# Patient Record
Sex: Female | Born: 1980 | Race: Black or African American | Hispanic: No | Marital: Single | Smoking: Current every day smoker
Health system: Southern US, Community
[De-identification: ages and names within clinical notes are randomized; demographics above are authoritative.]

## PROBLEM LIST (undated history)

## (undated) DIAGNOSIS — F32A Depression, unspecified: Secondary | ICD-10-CM

## (undated) DIAGNOSIS — F329 Major depressive disorder, single episode, unspecified: Secondary | ICD-10-CM

## (undated) DIAGNOSIS — I10 Essential (primary) hypertension: Secondary | ICD-10-CM

## (undated) DIAGNOSIS — E079 Disorder of thyroid, unspecified: Secondary | ICD-10-CM

## (undated) DIAGNOSIS — E119 Type 2 diabetes mellitus without complications: Secondary | ICD-10-CM

## (undated) DIAGNOSIS — F419 Anxiety disorder, unspecified: Secondary | ICD-10-CM

## (undated) DIAGNOSIS — E063 Autoimmune thyroiditis: Secondary | ICD-10-CM

## (undated) DIAGNOSIS — M797 Fibromyalgia: Secondary | ICD-10-CM

## (undated) HISTORY — PX: ABDOMINAL HYSTERECTOMY: SHX81

## (undated) HISTORY — PX: BREAST SURGERY: SHX581

## (undated) HISTORY — PX: TONSILLECTOMY: SUR1361

---
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
## 2019-02-17 ENCOUNTER — Other Ambulatory Visit: Payer: Self-pay

## 2019-02-17 ENCOUNTER — Encounter (HOSPITAL_COMMUNITY): Payer: Self-pay

## 2019-02-17 ENCOUNTER — Emergency Department (HOSPITAL_COMMUNITY): Payer: Medicaid Other

## 2019-02-17 ENCOUNTER — Emergency Department (HOSPITAL_COMMUNITY)
Admission: EM | Admit: 2019-02-17 | Discharge: 2019-02-17 | Disposition: A | Discharge status: Home / Self Care | Payer: Medicaid Other | Attending: Emergency Medicine | Admitting: Emergency Medicine

## 2019-02-17 DIAGNOSIS — F419 Anxiety disorder, unspecified: Secondary | ICD-10-CM | POA: Diagnosis not present

## 2019-02-17 DIAGNOSIS — F17210 Nicotine dependence, cigarettes, uncomplicated: Secondary | ICD-10-CM | POA: Insufficient documentation

## 2019-02-17 DIAGNOSIS — R51 Headache: Secondary | ICD-10-CM | POA: Insufficient documentation

## 2019-02-17 DIAGNOSIS — E119 Type 2 diabetes mellitus without complications: Secondary | ICD-10-CM | POA: Insufficient documentation

## 2019-02-17 DIAGNOSIS — I10 Essential (primary) hypertension: Secondary | ICD-10-CM | POA: Insufficient documentation

## 2019-02-17 DIAGNOSIS — R519 Headache, unspecified: Secondary | ICD-10-CM

## 2019-02-17 DIAGNOSIS — E079 Disorder of thyroid, unspecified: Secondary | ICD-10-CM | POA: Diagnosis not present

## 2019-02-17 HISTORY — DX: Fibromyalgia: M79.7

## 2019-02-17 HISTORY — DX: Anxiety disorder, unspecified: F41.9

## 2019-02-17 HISTORY — DX: Disorder of thyroid, unspecified: E07.9

## 2019-02-17 HISTORY — DX: Depression, unspecified: F32.A

## 2019-02-17 HISTORY — DX: Essential (primary) hypertension: I10

## 2019-02-17 HISTORY — DX: Type 2 diabetes mellitus without complications: E11.9

## 2019-02-17 LAB — CBC WITH DIFFERENTIAL/PLATELET
Abs Immature Granulocytes: 0.01 10*3/uL (ref 0.00–0.07)
Basophils Absolute: 0 10*3/uL (ref 0.0–0.1)
Basophils Relative: 0 %
Eosinophils Absolute: 0.1 10*3/uL (ref 0.0–0.5)
Eosinophils Relative: 1 %
HCT: 38.8 % (ref 36.0–46.0)
Hemoglobin: 12.2 g/dL (ref 12.0–15.0)
Immature Granulocytes: 0 %
Lymphocytes Relative: 28 %
Lymphs Abs: 1.7 10*3/uL (ref 0.7–4.0)
MCH: 26.8 pg (ref 26.0–34.0)
MCHC: 31.4 g/dL (ref 30.0–36.0)
MCV: 85.3 fL (ref 80.0–100.0)
Monocytes Absolute: 0.4 10*3/uL (ref 0.1–1.0)
Monocytes Relative: 6 %
Neutro Abs: 3.8 10*3/uL (ref 1.7–7.7)
Neutrophils Relative %: 65 %
Platelets: 233 10*3/uL (ref 150–400)
RBC: 4.55 MIL/uL (ref 3.87–5.11)
RDW: 12.9 % (ref 11.5–15.5)
WBC: 6 10*3/uL (ref 4.0–10.5)
nRBC: 0 % (ref 0.0–0.2)

## 2019-02-17 LAB — COMPREHENSIVE METABOLIC PANEL
ALT: 20 U/L (ref 0–44)
AST: 23 U/L (ref 15–41)
Albumin: 3.9 g/dL (ref 3.5–5.0)
Alkaline Phosphatase: 106 U/L (ref 38–126)
Anion gap: 10 (ref 5–15)
BUN: 8 mg/dL (ref 6–20)
CO2: 25 mmol/L (ref 22–32)
Calcium: 9 mg/dL (ref 8.9–10.3)
Chloride: 104 mmol/L (ref 98–111)
Creatinine, Ser: 0.73 mg/dL (ref 0.44–1.00)
GFR calc Af Amer: >60 mL/min (ref >60)
GFR calc non Af Amer: >60 mL/min (ref >60)
Glucose, Bld: 100 mg/dL — ABNORMAL HIGH (ref 70–99)
Potassium: 3.8 mmol/L (ref 3.5–5.1)
Sodium: 139 mmol/L (ref 135–145)
Total Bilirubin: 0.7 mg/dL (ref 0.3–1.2)
Total Protein: 7.9 g/dL (ref 6.5–8.1)

## 2019-02-17 LAB — I-STAT BETA HCG BLOOD, ED (MC, WL, AP ONLY): I-stat hCG, quantitative: <5 m[IU]/mL (ref <5)

## 2019-02-17 LAB — TSH: TSH: 2.048 u[IU]/mL (ref 0.350–4.500)

## 2019-02-17 MED ORDER — PROCHLORPERAZINE EDISYLATE 10 MG/2ML IJ SOLN
5.0000 mg | Freq: Once | INTRAMUSCULAR | Status: AC
  Administered 2019-02-17: 5 mg via INTRAVENOUS
  Filled 2019-02-17: qty 2

## 2019-02-17 MED ORDER — SODIUM CHLORIDE 0.9 % IV BOLUS
1000.0000 mL | Freq: Once | INTRAVENOUS | Status: AC
  Administered 2019-02-17: 1000 mL via INTRAVENOUS

## 2019-02-17 MED ORDER — DIPHENHYDRAMINE HCL 50 MG/ML IJ SOLN
12.5000 mg | Freq: Once | INTRAMUSCULAR | Status: AC
  Administered 2019-02-17: 12.5 mg via INTRAVENOUS
  Filled 2019-02-17: qty 1

## 2019-02-17 NOTE — Discharge Instructions (Signed)

## 2019-02-17 NOTE — ED Notes (Signed)
Bed: WLPT4 Expected date:  Expected time:  Means of arrival:  Comments: 

## 2019-02-17 NOTE — ED Provider Notes (Signed)
[facility]-EMERGENCY DEPT Provider Note   CSN: [id number] Arrival date & time: 02/17/19  1309     History   Chief Complaint Chief Complaint  Patient presents with  . Headache  . Anxiety    HPI Pamela Rivers is a 38 y.o. female.  Who presents the emergency department with chief complaint of headache, nausea vomiting and anxiety.  She has a past medical history of anxiety, depression, hypertension, thyroid disease.  The patient states that this morning she awoke with a severe throbbing right-sided headache.  She has had nausea and vomiting.  She is somewhat pressured in her speech and states that she just moved here 22 days ago with her girlfriend from [facility] [place].  She does not know anyone here and just found out that her longtime girlfriend has been cheating on her.  She has had low appetite, tremulousness, intermittent vomiting.  She denies diarrhea or abdominal pain.  She does state that her mother had an aneurysm it is somewhat concerned about this.  She denies any photophobia, phonophobia, unilateral weakness, difficulty with speech or swallowing, neck stiffness rash or fever.  She does state that the headache came on suddenly and is the worst headache she is ever had.     HPI  Past Medical History:  Diagnosis Date  . Anxiety   . Depression   . Diabetes mellitus without complication (HCC)   . Fibromyalgia   . Hypertension   . Thyroid disease     There are no active problems to display for this patient.   Past Surgical History:  Procedure Laterality Date  . ABDOMINAL HYSTERECTOMY    . BREAST SURGERY    . TONSILLECTOMY       OB History   No obstetric history on file.      Home Medications    Prior to Admission medications   Not on File    Family History Family History  Problem Relation Age of Onset  . Diabetes Mother   . Hypertension Mother   . Diabetes Father   . Hypertension Father     Social History Social History    Tobacco Use  . Smoking status: Current Every Day Smoker    Packs/day: 0.30    Types: Cigarettes  . Smokeless tobacco: Never Used  Substance Use Topics  . Alcohol use: Yes    Comment: occasionally  . Drug use: Yes    Types: Marijuana     Allergies   Vitamin d analogs   Review of Systems Review of Systems  Ten systems reviewed and are negative for acute change, except as noted in the HPI.   Physical Exam Updated Vital Signs BP (!) 139/94 (BP Location: Left Arm)   Pulse 97   Temp 98.5 F (36.9 C) (Oral)   Resp 13   Ht 5\' 4"  (1.626 m)   Wt 108 kg   SpO2 98%   BMI 40.85 kg/m   Physical Exam Vitals signs and nursing note reviewed.  Constitutional:      General: She is not in acute distress.    Appearance: She is well-developed. She is obese. She is not diaphoretic.  HENT:     Head: Normocephalic and atraumatic.  Eyes:     General: No scleral icterus.    Conjunctiva/sclera: Conjunctivae normal.     Pupils: Pupils are equal, round, and reactive to light.     Comments: No horizontal, vertical or rotational nystagmus  Neck:     Musculoskeletal: Normal  range of motion and neck supple.     Comments: Full active and passive ROM without pain No midline or paraspinal tenderness No nuchal rigidity or meningeal signs Cardiovascular:     Rate and Rhythm: Normal rate and regular rhythm.  Pulmonary:     Effort: Pulmonary effort is normal. No respiratory distress.     Breath sounds: Normal breath sounds. No wheezing or rales.  Abdominal:     General: Bowel sounds are normal.     Palpations: Abdomen is soft.     Tenderness: There is no abdominal tenderness. There is no guarding or rebound.  Musculoskeletal: Normal range of motion.  Lymphadenopathy:     Cervical: No cervical adenopathy.  Skin:    General: Skin is warm and dry.     Findings: No rash.  Neurological:     Mental Status: She is alert and oriented to person, place, and time.     GCS: GCS eye subscore is 4.  GCS verbal subscore is 5. GCS motor subscore is 6.     Cranial Nerves: No cranial nerve deficit.     Motor: No abnormal muscle tone.     Coordination: Coordination normal.     Comments: Mental Status:  Alert, oriented, thought content appropriate. Speech fluent without evidence of aphasia. Able to follow 2 step commands without difficulty.  Cranial Nerves:  II:  Peripheral visual fields grossly normal, pupils equal, round, reactive to light III,IV, VI: ptosis not present, extra-ocular motions intact bilaterally  V,VII: smile symmetric, facial light touch sensation equal VIII: hearing grossly normal bilaterally  IX,X: midline uvula rise  XI: bilateral shoulder shrug equal and strong XII: midline tongue extension  Motor:  5/5 in upper and lower extremities bilaterally including strong and equal grip strength and dorsiflexion/plantar flexion Sensory: Pinprick and light touch normal in all extremities.  Cerebellar: normal finger-to-nose with bilateral upper extremities Gait: normal gait and balance CV: distal pulses palpable throughout   Psychiatric:        Behavior: Behavior normal.        Thought Content: Thought content normal.        Judgment: Judgment normal.      ED Treatments / Results  Labs (all labs ordered are listed, but only abnormal results are displayed) Labs Reviewed - No data to display  EKG    Radiology No results found.  Procedures Procedures (including critical care time)  Medications Ordered in ED Medications - No data to display   Initial Impression / Assessment and Plan / ED Course  I have reviewed the triage vital signs and the nursing notes.  Pertinent labs & imaging results that were available during my care of the patient were reviewed by me and considered in my medical decision making (see chart for details).        Patient headache is improved.  I personally reviewed the patient's head CT which is negative for acute hemorrhagic stroke or  other abnormality.  CBC is without abnormality, no leukocytosis.  TSH is still in process.  She is a negative pregnancy.  CMP in process however patient feels that she is ready to leave and does not wish to stating longer.  I do not feel that she needs to leave AGAINST MEDICAL ADVICE although her CMP is still pending.  Patient appears appropriate for discharge.  I have referred her to outpatient psychiatric facilities for help with her emotional distress and anxiety.  Gust return precautions f Final Clinical Impressions(s) / ED Diagnoses  Final diagnoses:  None    ED Discharge Orders    None       Margarita Mail, PA-C 02/17/19 1715    Davonna Belling, MD 02/17/19 2200

## 2019-02-17 NOTE — ED Triage Notes (Signed)
Patient c/o right sided headache that started this AM. Patient states she has been having intermittent emesis since 01/26/19. Patient states, "I am extremely stressed." I  Do not have anything for my stress." Patient states she used to take hydralazine, but she overdosed on that during a suicide attempt.

## 2019-08-02 ENCOUNTER — Ambulatory Visit: Payer: Self-pay

## 2019-08-02 NOTE — Telephone Encounter (Signed)
  Returned call to patient who states that she has had hypertension for a few days.  She says she has not felt well and checked her BP and found 142/97 yesterday and has a broken blood vesicle in her eye. Today her BP 159/100 and she took an asa and some apple cider vinegar and it is now down to 127/78.  She states that she has a headache. She has been on medication in the past  Per protocol patient was told that she needs to seek medical attention at [facility] or ER.  She verbalized understanding.  She was also give the numbers for several community health centers who take patient without insurance. She verbalized understanding of all information. Reason for Disposition . [3] Systolic BP  >= 818 OR Diastolic >= 299  AND [3] having NO cardiac or neurologic symptoms  Answer Assessment - Initial Assessment Questions 1. BLOOD PRESSURE: "What is the blood pressure?" "Did you take at least two measurements 5 minutes apart?"     142/97  Yesterday  Today 138/97  159/100 now 127/78 2. ONSET: "When did you take your blood pressure?"     home 3. HOW: "How did you obtain the blood pressure?" (e.g., visiting nurse, automatic home BP monitor)     home 4. HISTORY: "Do you have a history of high blood pressure?"     Yes 8 years ago the 5. MEDICATIONS: "Are you taking any medications for blood pressure?" "Have you missed any doses recently?"    No medications 6. OTHER SYMPTOMS: "Do you have any symptoms?" (e.g., headache, chest pain, blurred vision, difficulty breathing, weakness)     Headache, rupture in eye 7. PREGNANCY: "Is there any chance you are pregnant?" "When was your last menstrual period?"    No histerectomy  Protocols used: HIGH BLOOD PRESSURE-A-AH

## 2019-11-18 ENCOUNTER — Encounter (HOSPITAL_COMMUNITY): Payer: Self-pay

## 2019-11-18 ENCOUNTER — Other Ambulatory Visit: Payer: Self-pay

## 2019-11-18 ENCOUNTER — Emergency Department (HOSPITAL_COMMUNITY)
Admission: EM | Admit: 2019-11-18 | Discharge: 2019-11-18 | Disposition: A | Discharge status: Home / Self Care | Payer: Medicaid Other | Attending: Emergency Medicine | Admitting: Emergency Medicine

## 2019-11-18 DIAGNOSIS — E119 Type 2 diabetes mellitus without complications: Secondary | ICD-10-CM | POA: Diagnosis not present

## 2019-11-18 DIAGNOSIS — F17210 Nicotine dependence, cigarettes, uncomplicated: Secondary | ICD-10-CM | POA: Diagnosis not present

## 2019-11-18 DIAGNOSIS — R519 Headache, unspecified: Secondary | ICD-10-CM | POA: Insufficient documentation

## 2019-11-18 DIAGNOSIS — I10 Essential (primary) hypertension: Secondary | ICD-10-CM | POA: Diagnosis not present

## 2019-11-18 DIAGNOSIS — H538 Other visual disturbances: Secondary | ICD-10-CM | POA: Insufficient documentation

## 2019-11-18 DIAGNOSIS — H5712 Ocular pain, left eye: Secondary | ICD-10-CM | POA: Diagnosis present

## 2019-11-18 DIAGNOSIS — H539 Unspecified visual disturbance: Secondary | ICD-10-CM

## 2019-11-18 LAB — CBC WITH DIFFERENTIAL/PLATELET
Abs Immature Granulocytes: 0.02 10*3/uL (ref 0.00–0.07)
Basophils Absolute: 0 10*3/uL (ref 0.0–0.1)
Basophils Relative: 1 %
Eosinophils Absolute: 0.2 10*3/uL (ref 0.0–0.5)
Eosinophils Relative: 3 %
HCT: 39.3 % (ref 36.0–46.0)
Hemoglobin: 12.1 g/dL (ref 12.0–15.0)
Immature Granulocytes: 0 %
Lymphocytes Relative: 35 %
Lymphs Abs: 2.2 10*3/uL (ref 0.7–4.0)
MCH: 25.9 pg — ABNORMAL LOW (ref 26.0–34.0)
MCHC: 30.8 g/dL (ref 30.0–36.0)
MCV: 84.2 fL (ref 80.0–100.0)
Monocytes Absolute: 0.4 10*3/uL (ref 0.1–1.0)
Monocytes Relative: 6 %
Neutro Abs: 3.5 10*3/uL (ref 1.7–7.7)
Neutrophils Relative %: 55 %
Platelets: 241 10*3/uL (ref 150–400)
RBC: 4.67 MIL/uL (ref 3.87–5.11)
RDW: 12.6 % (ref 11.5–15.5)
WBC: 6.2 10*3/uL (ref 4.0–10.5)
nRBC: 0 % (ref 0.0–0.2)

## 2019-11-18 LAB — BASIC METABOLIC PANEL
Anion gap: 8 (ref 5–15)
BUN: 9 mg/dL (ref 6–20)
CO2: 24 mmol/L (ref 22–32)
Calcium: 8.7 mg/dL — ABNORMAL LOW (ref 8.9–10.3)
Chloride: 105 mmol/L (ref 98–111)
Creatinine, Ser: 0.57 mg/dL (ref 0.44–1.00)
GFR calc Af Amer: >60 mL/min (ref >60)
GFR calc non Af Amer: >60 mL/min (ref >60)
Glucose, Bld: 140 mg/dL — ABNORMAL HIGH (ref 70–99)
Potassium: 4.4 mmol/L (ref 3.5–5.1)
Sodium: 137 mmol/L (ref 135–145)

## 2019-11-18 MED ORDER — TETRACAINE HCL 0.5 % OP SOLN
2.0000 [drp] | Freq: Once | OPHTHALMIC | Status: AC
  Administered 2019-11-18: 2 [drp] via OPHTHALMIC
  Filled 2019-11-18: qty 4

## 2019-11-18 MED ORDER — FLUORESCEIN SODIUM 1 MG OP STRP
1.0000 | ORAL_STRIP | Freq: Once | OPHTHALMIC | Status: AC
  Administered 2019-11-18: 1 via OPHTHALMIC
  Filled 2019-11-18: qty 1

## 2019-11-18 NOTE — Discharge Instructions (Addendum)
Please drive to the office of Dr Dione Booze MD, at the address listed above, as soon as you are discharged from the ED.  They are expecting you for an office visit.  Bring these papers to show to them.  For Dr Dione Booze:  In the ED:  IOP: 19 mm hg (left), Vision 20/20 bilaterally, no corneal abrasion on fluorescein staining, peripheral fields intact, no detachment visualized on ocular ultrasound.  Vision gradually improving from arrival to discharge.   Possible complex migraine.  Neurologist did not feel this was consistent with optic neuritis, aneurysm, or RAO/RVO.  Recommended full ophthalmological exam.

## 2019-11-18 NOTE — ED Provider Notes (Addendum)
[facility]-EMERGENCY DEPT Provider Note   CSN: [id number] Arrival date & time: 11/18/19  1048     History Chief Complaint  Patient presents with  . Headache    "Pamela Cloud" Rivers is a 39 y.o. female (prefers female pronouns) with fibromyalgia, hypertension, diabetes, presented to emergency department with vision loss and pain in the left eye.  Patient reports that he woke up early this morning with pain in his left eye and loss of vision.  He said everything was extremely blurry (but no darkness) in his left eye.  Also reports he was having significant pain in his eye and directly behind the eye in his head.  He washed his eye and the vision gradually improved, from the point that he couldn't make out anything on his phone, to blurry letters, and now appears "mostly clear."  Currently he feels there is "double vision" in his left eye, "like words are stacked on top of themselves funny," but denies any blackness in his peripheral fields.  He does not wear contacts and has excellent vision at baseline.  He does have a history of headaches and migraines, but these tend to be located in the right temple and are never associated with vision loss or any neurological symptoms.  He reports a family history of brain aneurysm in his mother.  He is unaware of any family history of MS but says that he was not raised by his biological parents is unclear about their extended family history.  The patient is NOT on hormone therapy and hasn't been for nearly 3 years.  HTN - untreated recently, was taken off medications after weight loss a few months ago, but has put weight back on  HPI     Past Medical History:  Diagnosis Date  . Anxiety   . Depression   . Diabetes mellitus without complication (HCC)   . Fibromyalgia   . Hypertension   . Thyroid disease     There are no problems to display for this patient.   Past Surgical History:  Procedure Laterality Date  . ABDOMINAL  HYSTERECTOMY    . BREAST SURGERY    . TONSILLECTOMY       OB History   No obstetric history on file.     Family History  Problem Relation Age of Onset  . Diabetes Mother   . Hypertension Mother   . Diabetes Father   . Hypertension Father     Social History   Tobacco Use  . Smoking status: Current Every Day Smoker    Packs/day: 0.30    Types: Cigarettes  . Smokeless tobacco: Never Used  Substance Use Topics  . Alcohol use: Yes    Comment: occasionally  . Drug use: Not Currently    Types: Marijuana    Home Medications Prior to Admission medications   Not on File    Allergies    Other and Vitamin d analogs  Review of Systems   Review of Systems  Constitutional: Negative for chills and fever.  Eyes: Positive for photophobia, pain and visual disturbance. Negative for discharge, redness and itching.  Respiratory: Negative for cough and shortness of breath.   Cardiovascular: Negative for chest pain and palpitations.  Gastrointestinal: Negative for nausea and vomiting.  Skin: Negative for pallor and rash.  Neurological: Positive for headaches. Negative for dizziness, seizures, syncope, facial asymmetry, weakness and numbness.  All other systems reviewed and are negative.   Physical Exam Updated Vital Signs BP (!) 143/99  Pulse 63   Temp 98.4 F (36.9 C) (Oral)   Resp 18   Ht 5' 3.75" (1.619 m)   Wt 108.4 kg   SpO2 100%   BMI 41.35 kg/m   Physical Exam Vitals and nursing note reviewed.  Constitutional:      General: She is not in acute distress.    Appearance: She is obese.  HENT:     Head: Normocephalic and atraumatic.     Comments: No temporal artery ttp or claudication Eyes:     Conjunctiva/sclera: Conjunctivae normal.     Comments: Bilateral horizontal beating nystagmus with bilateral gaze (patient claims this is chronic) PERRL, 3 mm bilaterally Conjunctiva are clear Vision 20/20 in bilateral eyes Peripheral fields are intact No visible  lesions of the external eye  Cardiovascular:     Rate and Rhythm: Normal rate and regular rhythm.     Heart sounds: Normal heart sounds.  Pulmonary:     Effort: Pulmonary effort is normal. No respiratory distress.     Breath sounds: Normal breath sounds.  Musculoskeletal:     Cervical back: Normal range of motion and neck supple.  Skin:    General: Skin is warm and dry.  Neurological:     Mental Status: She is alert.     GCS: GCS eye subscore is 4. GCS verbal subscore is 5. GCS motor subscore is 6.     Cranial Nerves: Cranial nerves are intact.     Sensory: Sensation is intact.     Motor: Motor function is intact.     Gait: Gait is intact.     Comments: No change in color phenomenon (red color the same in both eyes)     ED Results / Procedures / Treatments   Labs (all labs ordered are listed, but only abnormal results are displayed) Labs Reviewed  BASIC METABOLIC PANEL - Abnormal; Notable for the following components:      Result Value   Glucose, Bld 140 (*)    Calcium 8.7 (*)    All other components within normal limits  CBC WITH DIFFERENTIAL/PLATELET - Abnormal; Notable for the following components:   MCH 25.9 (*)    All other components within normal limits    EKG None  Radiology No results found.  Procedures Ultrasound ED Ocular  Date/Time: 11/18/2019 6:15 PM Performed by: Wyvonnia Dusky, MD Authorized by: Wyvonnia Dusky, MD   PROCEDURE DETAILS:    Indications: eye pain and visual change     Assessed:  Left eye   Left eye axial view: obtained     Left eye saggital view: obtained     Images: archived     Limitations:  None LEFT EYE FINDINGS:     no foreign body noted in left eye    left eye lens not dislodged    no evidence of retinal detachment of the left eye    no vitreous hemorrhage in left eye   (including critical care time)  Medications Ordered in ED Medications  fluorescein ophthalmic strip 1 strip (1 strip Both Eyes Given by Other  11/18/19 1308)  tetracaine (PONTOCAINE) 0.5 % ophthalmic solution 2 drop (2 drops Both Eyes Given by Other 11/18/19 1308)    ED Course  I have reviewed the triage vital signs and the nursing notes.  Pertinent labs & imaging results that were available during my care of the patient were reviewed by me and considered in my medical decision making (see chart for details).  39 year old  FTM (Prefers "Pamela Cloud") w/ family hx of brain aneurysm presenting to the ED with sudden painful vision loss in his left eye beginning earlier this morning.  Vision has since improved and is currently 20/20 in the ED with no peripheral visual defects.  However he continues to have a sense of blurred or double vision only in his left eye when staring at letters.  He also continues to report some mild persistent pain in his left eye and headache.  No evidence of injury to the eye itself, no abrasions, no tearing of the eye.  No photophobia.  No corneal abrasion.  No scleral injection  Doubtful of acute angle glaucoma, no redness of the eye, no fixed pupil or photophobia.    Doubtful of uveitis or conjunctivis with no scleral injection, pain or photophobia  Vision intact - less likely retinal detachment, and no findings of detachment on ocular ultrasound.  Detachment is also not typically painful.  Neurologically, I discussed this case with Dr Amada Jupiter who says that a single day of sudden-onset painful vision loss and recovery is inconsistent with optic neuritis.  No loss or change in color vision (red) either on bilateral comparative ocular exam.  This is less likely TIA or RAO with the patient's lack of risk factors and associated pain.  No temporal artery pain or claudication to suggest temporal arteritis.  The patient also does not fit the age demographic, and I would not expect this condition to improve so rapidly either.  This may be a complex migraine.  Pain and vision have been gradually improving in the ED.  I  think it's reasonable to have him seen for an ophthalmological exam in the office, and discussed this with Dr. Marchelle Gearing below.  Clinical Course as of Nov 18 1818  Fri Nov 18, 2019  1244 I spoke to Dr Amada Jupiter of neurology who tells me that this history is NOT consistent with any emergent neurological process including optic neuritis (the speed of symptom onset and offset), or related to brain aneurysm, or RAO/RVO given her associated pain.  He recommended further evaluation for ophthalmological causes of blurred vision.   [MT]  1341 Bedside US shows no retinal detachment.  IOP 19 mm hg left eye. No corneal abrasion.  I spoke to Dr Dione Booze ohpthalmology who recommended having patient come to office for ocular eval   [MT]    Clinical Course User Index [MT] Luisfelipe Engelstad, Kermit Balo, MD   Final Clinical Impression(s) / ED Diagnoses Final diagnoses:  Vision changes    Rx / DC Orders ED Discharge Orders    None       Hridhaan Yohn, Kermit Balo, MD 11/18/19 1820    Terald Sleeper, MD 11/18/19 Rickey Primus

## 2019-11-18 NOTE — ED Triage Notes (Signed)
Patient reports that she has migraines on the right side of her head, but last night had pain on the left side of her head. Patient states she lost vision in her left eye totally that lasted an hour. Today the headache on the left is about the same, but is now having double vision. Patient denies any N/v or sensitivity to light.

## 2019-11-18 NOTE — ED Notes (Signed)
Eye supply to bedside

## 2019-12-01 ENCOUNTER — Encounter: Payer: Self-pay | Admitting: Allergy & Immunology

## 2019-12-01 ENCOUNTER — Other Ambulatory Visit: Payer: Self-pay

## 2019-12-01 ENCOUNTER — Ambulatory Visit (INDEPENDENT_AMBULATORY_CARE_PROVIDER_SITE_OTHER): Payer: Medicaid Other | Admitting: Allergy & Immunology

## 2019-12-01 VITALS — BP 132/90 | HR 113 | Temp 97.6°F | Resp 20 | Ht 63.1 in | Wt 247.0 lb

## 2019-12-01 DIAGNOSIS — J310 Chronic rhinitis: Secondary | ICD-10-CM | POA: Diagnosis not present

## 2019-12-01 DIAGNOSIS — T7800XD Anaphylactic reaction due to unspecified food, subsequent encounter: Secondary | ICD-10-CM

## 2019-12-01 DIAGNOSIS — F17200 Nicotine dependence, unspecified, uncomplicated: Secondary | ICD-10-CM

## 2019-12-01 DIAGNOSIS — L2089 Other atopic dermatitis: Secondary | ICD-10-CM

## 2019-12-01 MED ORDER — FLUTICASONE PROPIONATE 50 MCG/ACT NA SUSP: 1.0000 | Freq: Every day | NASAL | 5 refills | Status: AC

## 2019-12-01 MED ORDER — CETIRIZINE HCL 10 MG PO TABS: 10.0000 mg | ORAL_TABLET | Freq: Every day | ORAL | 5 refills | Status: AC

## 2019-12-01 NOTE — Progress Notes (Signed)
NEW PATIENT  Date of Service/Encounter:  12/01/19  Referring provider: Patient, No Pcp Per   Assessment:   Anaphylactic shock due to food  Chronic rhinitis   Eli Hose is a delightful 39 year old presenting for an evaluation of concerns for food allergies and chronic rhinitis.  Unfortunately, his testing today is largely nonreactive.  He endorses avoiding antihistamines for enough days, but I wonder if something else in his medication list has anti-histaminergic properties that we are completely missing.  Regardless, we are going to get quite a bit of lab work to clarify this further.  In the interim, we did provide an anaphylaxis management plan and epinephrine autoinjector to have on hand if needed.  We continue with cetirizine 10 mg daily and fluticasone 1 spray per nostril daily.  We will reconvene in a couple of months.  Plan/Recommendations:   1. Anaphylactic shock due to food - We are going to get blood work since all of the skin testing was negative. - I am going to add on an alpha gal panel given your propensity to react to red meats as well as gel tabs. - We will call you in 1 to 2 weeks to let you know the results of the testing. - In the meantime, we are going to prescribe an EpiPen. - Anaphylaxis management plan provided.  2. Chronic rhinitis - Testing was negative to the environmental allergens. - We are going to get blood work to confirm this. - We will call you in 1-2 weeks with the results of the testing. - Continue with cetirizine 75m daily. - Continue with fluticasone one spray per nostril daily.  3. Return in about 2 months (around 01/31/2020). This can be an in-person, a virtual Webex or a telephone follow up visit.    Subjective:   LIdell Hissongis a 39y.o. adult presenting today for evaluation of  Chief Complaint  Patient presents with  . Food Intolerance    tomato, vitamin D (cause itching, swelling lips)  . Allergic Rhinitis     Congestion,  Cough, Runny nose    LCheyan Freeshas a history of the following: Patient Active Problem List   Diagnosis Date Noted  . 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 Flexural atopic dermatitis 12/03/2019  . Chronic rhinitis 12/03/2019  . Current smoker 12/03/2019    History obtained from: chart review and patient.  LBabygirl Tragerwas referred by Patient, No Pcp Per.     LRenetteis a 39y.o. adult presenting for an evaluation of multiple complaints, including food allergies and environmental allergies.   Asthma/Respiratory Symptom History: He does have a diagnosis of asthma, but this was more before he lost 100 pounds. He did have an inhaler years ago that he took every day. Symptoms were around 2008 or earlier. He has never been hospitalized for his breathing. He does report that he "stopped breathing" when he was 39years old and was told that he "almost died".   Allergic Rhinitis Symptom History: He has been around the dog for one year in May. He has been around the dog for one year. He reports itchy watery eyes. There is long hair as well that results in itchiness. He has taken an off brand allergy medication during the reactions and it does seem to help. He also reports reactions to pollens as well dust. He does use a nose spray and has used one in the past. He has used fluticasone in the past  Food  Allergy Symptom History: He reports that he has reactions to vitamin D capsules. He has never tried changing his vitamin D formulation. He does have issues with milk and cereal. This is mostly results in urtic. aria with throat closure. This was around 7 years ago or so. He was also reports that he reacts to tomatoes with a rash around his mouth. This is worsening over time. He does know other triggering foods. He does react to certain vegetables. These have developed more recently, over the past few months.   Eczema Symptom History: He does report some problems under his  pannus with some redness. It is does become painful. He has some spots on his elbows. He did use a brown sugar scrub.      Otherwise, there is no history of other atopic diseases, including asthma, drug allergies, stinging insect allergies, urticaria or contact dermatitis. There is no significant infectious history. Vaccinations are up to date.    Past Medical History: Patient Active Problem List   Diagnosis Date Noted  . 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 Flexural atopic dermatitis 12/03/2019  . Chronic rhinitis 12/03/2019  . Current smoker 12/03/2019    Medication List:  Allergies as of 12/01/2019      Reactions   Other    Tomato   Vitamin D Analogs       Medication List       Accurate as of December 01, 2019 11:59 PM. If you have any questions, ask your nurse or doctor.        baclofen 10 MG tablet Commonly known as: LIORESAL Take 10 mg by mouth 3 (three) times daily.   cetirizine 10 MG tablet Commonly known as: ZYRTEC Take 1 tablet (10 mg total) by mouth daily. Started by: [name], MD   escitalopram 20 MG tablet Commonly known as: LEXAPRO Take 20 mg by mouth daily.   fluticasone 50 MCG/ACT nasal spray Commonly known as: FLONASE Place 1 spray into both nostrils daily. Started by: [name], MD   rosuvastatin 10 MG tablet Commonly known as: CRESTOR Take 10 mg by mouth daily.       Birth History: non-contributory (born female, transitioning to female)  Developmental History: Kylah has met all milestones on time. He has required no speech therapy, occupational therapy and physical therapy.   Past Surgical History: Past Surgical History:  Procedure Laterality Date  . ABDOMINAL HYSTERECTOMY    . BREAST SURGERY    . TONSILLECTOMY       Family History: Family History  Problem Relation Age of Onset  . Diabetes Mother   . Hypertension Mother   . Diabetes Father   . Hypertension Father      Social  History: Jesyka lives at home with his girlfriend. He is a Geophysicist/field seismologist and works for himself. He mostly take nature photos and still photos from photo shoots. He owns [employer], which has a [employer].  He does smoke cigarettes.    Review of Systems  Constitutional: Negative.  Negative for chills, fever, malaise/fatigue and weight loss.  HENT: Negative.  Negative for congestion, ear discharge and ear pain.   Eyes: Negative for pain, discharge and redness.  Respiratory: Negative for cough, sputum production, shortness of breath and wheezing.   Cardiovascular: Negative.  Negative for chest pain and palpitations.  Gastrointestinal: Negative for abdominal pain, constipation, diarrhea, heartburn, nausea and vomiting.  Skin: Negative.  Negative for itching and rash.  Neurological: Negative for  dizziness and headaches.  Endo/Heme/Allergies: Negative for environmental allergies. Does not bruise/bleed easily.       Objective:   Blood pressure 132/90, pulse (!) 113, temperature 97.6 F (36.4 C), temperature source Temporal, resp. rate 20, height 5' 3.1" (1.603 m), weight 247 lb (112 kg), SpO2 97 %. Body mass index is 43.62 kg/m.   Physical Exam:   Physical Exam  Constitutional: He appears well-developed and well-nourished.  Obese.   HENT:  Head: Normocephalic and atraumatic.  Right Ear: Tympanic membrane, external ear and ear canal normal. No drainage, swelling or tenderness. Tympanic membrane is not injected, not scarred, not erythematous, not retracted and not bulging.  Left Ear: Tympanic membrane, external ear and ear canal normal. No drainage, swelling or tenderness. Tympanic membrane is not injected, not scarred, not erythematous, not retracted and not bulging.  Nose: Mucosal edema and rhinorrhea present. No nasal deformity or septal deviation. No epistaxis. Right sinus exhibits no maxillary sinus tenderness and no frontal sinus tenderness. Left sinus exhibits no  maxillary sinus tenderness and no frontal sinus tenderness.  Mouth/Throat: Uvula is midline and oropharynx is clear and moist. Mucous membranes are not pale and not dry.  Eyes: Pupils are equal, round, and reactive to light. Conjunctivae and EOM are normal. Right eye exhibits no chemosis and no discharge. Left eye exhibits no chemosis and no discharge. Right conjunctiva is not injected. Left conjunctiva is not injected.  Cardiovascular: Normal rate, regular rhythm and normal heart sounds.  Respiratory: Effort normal and breath sounds normal. No accessory muscle usage. No tachypnea. No respiratory distress. He has no wheezes. He has no rhonchi. He has no rales. He exhibits no tenderness.  Moving air well in all lung fields.   GI: There is no abdominal tenderness. There is no rebound and no guarding.  Lymphadenopathy:       Head (right side): No submandibular, no tonsillar and no occipital adenopathy present.       Head (left side): No submandibular, no tonsillar and no occipital adenopathy present.    He has no cervical adenopathy.  Neurological: He is alert.  Skin: No abrasion, no petechiae and no rash noted. Rash is not papular, not vesicular and not urticarial. No erythema. No pallor.  Psychiatric: He has a normal mood and affect.     Diagnostic studies:     Allergy Studies:    Airborne Adult Perc - 12/01/19 1459    Time Antigen Placed  1459    Allergen Manufacturer  Lavella Hammock    Location  Back    Number of Test  59    1. Control-Buffer 50% Glycerol  Negative    2. Control-Histamine 1 mg/ml  2+    3. Albumin saline  Negative    4. [place]  Negative    5. [place]  Negative    6. [place]  Negative    7. [place] Blue  Negative    8. Meadow Fescue  Negative    9. Perennial Rye  Negative    10. Sweet Vernal  Negative    11. Timothy  Negative    12. Cocklebur  Negative    13. Burweed Marshelder  Negative    14. Ragweed, short  Negative    15. Ragweed, Giant  Negative    16. Plantain,   English  Negative    17. Lamb's Quarters  Negative    18. Sheep Sorrell  Negative    19. Rough Pigweed  Negative    20. Andreas Ohm, Rough  Negative    21. Mugwort, Common  Negative    22. Ash mix  Negative    23. Birch mix  Negative    24. Beech American  Negative    25. Box, Elder  Negative    26. Cedar, red  Negative    27. Cottonwood, [place]  Negative    28. Elm mix  Negative    29. Hickory mix  Negative    30. Maple mix  Negative    31. Oak, [place] mix  Negative    32. Pecan Pollen  Negative    33. Pine mix  Negative    34. Sycamore Eastern  Negative    35. [place], Black Pollen  Negative    36. Alternaria alternata  Negative    37. Cladosporium Herbarum  Negative    38. Aspergillus mix  Negative    39. Penicillium mix  Negative    40. Bipolaris sorokiniana (Helminthosporium)  Negative    41. Drechslera spicifera (Curvularia)  Negative    42. Mucor plumbeus  Negative    43. Fusarium moniliforme  Negative    44. Aureobasidium pullulans (pullulara)  Negative    45. Rhizopus oryzae  Negative    46. Botrytis cinera  Negative    47. Epicoccum nigrum  Negative    48. Phoma betae  Negative    49. Candida Albicans  Negative    50. Trichophyton mentagrophytes  Negative    51. Mite, D Farinae  5,000 AU/ml  Negative    52. Mite, D Pteronyssinus  5,000 AU/ml  Negative    53. Cat Hair 10,000 BAU/ml  Negative    54.  Dog Epithelia  Negative    55. Mixed Feathers  Negative    56. Horse Epithelia  Negative    57. Cockroach, German  Negative    58. Mouse  Negative    59. Tobacco Leaf  Negative     Food Adult Perc - 12/01/19 1400    Time Antigen Placed  1459    Allergen Manufacturer  Lavella Hammock    Location  Back    Number of allergen test  27     Control-buffer 50% Glycerol  Negative    Control-Histamine 1 mg/ml  2+    1. Peanut  Negative    3. Wheat  Negative    5. Milk, cow  Negative    6. Egg White, Chicken  Negative    9. Fish Mix  Negative    14. Hazelnut  Negative     17. Pistachio  Negative    22. Salmon  Negative    26. Crab  Negative    38. [place] Meat  Negative    39. Chicken Meat  Negative    40. Beef  Negative    42. Tomato  Negative    45. Pea, Green/English  Negative    47. Mushrooms  Negative    49. Onion  Negative    56. Orange   Negative    58. Apple  Negative    59. Peach  Negative    60. Strawberry  Negative    63. Pineapple  Negative    67. Cinnamon  Negative    68. Nutmeg  Negative    69. Ginger  Negative    70. Garlic  Negative    72. Mustard  Negative    6. Other  Negative   fire ant      Allergy testing results were read and interpreted by  myself, documented by clinical staff.         Salvatore Marvel, MD [facility]

## 2019-12-01 NOTE — Patient Instructions (Addendum)
1. Anaphylactic shock due to food - We are going to get blood work since all of the skin testing was negative. - I am going to add on an alpha gal panel given your propensity to react to red meats as well as gel tabs. - We will call you in 1 to 2 weeks to let you know the results of the testing. - In the meantime, we are going to prescribe an EpiPen. - Anaphylaxis management plan provided.  2. Chronic rhinitis - Testing was negative to the environmental allergens. - We are going to get blood work to confirm this. - We will call you in 1-2 weeks with the results of the testing. - Continue with cetirizine 10mg  daily. - Continue with fluticasone one spray per nostril daily.  3. Return in about 2 months (around 01/31/2020). This can be an in-person, a virtual Webex or a telephone follow up visit.   Please inform 04/01/2020 of any Emergency Department visits, hospitalizations, or changes in symptoms. Call us before going to the ED for breathing or allergy symptoms since we might be able to fit you in for a sick visit. Feel free to contact us anytime with any questions, problems, or concerns.  It was a pleasure to meet you today!  Websites that have reliable patient information: 1. American Academy of Asthma, Allergy, and Immunology: www.aaaai.org 2. Food Allergy Research and Education (FARE): foodallergy.org 3. Mothers of Asthmatics: http://www.asthmacommunitynetwork.org 4. American College of Allergy, Asthma, and Immunology: www.acaai.org   COVID-19 Vaccine Information can be found at: [place] For questions related to vaccine distribution or appointments, please email [email or web address] or call [phone].     "Like" [phone] on Facebook and Instagram for our latest updates!       HAPPY SPRING!  Make sure you are registered to vote! If you have moved or changed any of your contact information, you will need to get this  updated before voting!  In some cases, you MAY be able to register to vote online: [place]

## 2019-12-03 ENCOUNTER — Encounter: Payer: Self-pay | Admitting: Allergy & Immunology

## 2019-12-03 DIAGNOSIS — T7800XA Anaphylactic reaction due to unspecified food, initial encounter: Secondary | ICD-10-CM | POA: Insufficient documentation

## 2019-12-03 DIAGNOSIS — J310 Chronic rhinitis: Secondary | ICD-10-CM | POA: Insufficient documentation

## 2019-12-03 DIAGNOSIS — L2089 Other atopic dermatitis: Secondary | ICD-10-CM | POA: Insufficient documentation

## 2019-12-03 DIAGNOSIS — F17200 Nicotine dependence, unspecified, uncomplicated: Secondary | ICD-10-CM | POA: Insufficient documentation

## 2019-12-05 ENCOUNTER — Other Ambulatory Visit: Payer: Self-pay

## 2019-12-05 DIAGNOSIS — T7800XD Anaphylactic reaction due to unspecified food, subsequent encounter: Secondary | ICD-10-CM

## 2019-12-05 MED ORDER — EPINEPHRINE 0.3 MG/0.3ML IJ SOAJ: 0.3000 mg | Freq: Once | INTRAMUSCULAR | 1 refills | Status: AC

## 2019-12-05 MED ORDER — EPINEPHRINE 0.3 MG/0.3ML IJ SOAJ: 0.3000 mg | Freq: Once | INTRAMUSCULAR | 2 refills | Status: DC

## 2019-12-07 LAB — ALLERGEN PROFILE, VEGETABLE II
Allergen Carrot IgE: <0.1 kU/L
Allergen Green Bean IgE: <0.1 kU/L
Allergen Green Pea IgE: <0.1 kU/L
Allergen Onion IgE: <0.1 kU/L
Allergen Potato, White IgE: <0.1 kU/L
Allergen Tomato, IgE: <0.1 kU/L
Kidney Bean IgE: <0.1 kU/L
Pumpkin IgE: <0.1 kU/L
Soybean IgE: <0.1 kU/L

## 2019-12-07 LAB — ALLERGEN PROFILE, VEGETABLE I
Allergen Broccoli: <0.1 kU/L
Allergen Cabbage IgE: <0.1 kU/L
Allergen Cauliflower IgE: <0.1 kU/L
Allergen Celery IgE: <0.1 kU/L
Allergen Cucumber IgE: <0.1 kU/L
Allergen Lettuce IgE: <0.1 kU/L
F214-IgE Spinach: <0.1 kU/L

## 2019-12-07 LAB — ALPHA-GAL PANEL
Alpha Gal IgE*: <0.1 kU/L (ref <0.10)
Beef (Bos spp) IgE: <0.1 kU/L (ref <0.35)
Class Interpretation: 0
Class Interpretation: 0
Class Interpretation: 0
Lamb/Mutton (Ovis spp) IgE: <0.1 kU/L (ref <0.35)
Pork (Sus spp) IgE: <0.1 kU/L (ref <0.35)

## 2019-12-07 LAB — IGE+ALLERGENS ZONE 2(30)
Alternaria Alternata IgE: <0.1 kU/L
Amer Sycamore IgE Qn: <0.1 kU/L
Aspergillus Fumigatus IgE: <0.1 kU/L
Bahia Grass IgE: <0.1 kU/L
Bermuda Grass IgE: <0.1 kU/L
Cat Dander IgE: <0.1 kU/L
Cedar, Mountain IgE: <0.1 kU/L
Cladosporium Herbarum IgE: <0.1 kU/L
Cockroach, American IgE: <0.1 kU/L
Common Silver Birch IgE: <0.1 kU/L
D Farinae IgE: 0.4 kU/L — AB
D Pteronyssinus IgE: 0.34 kU/L — AB
Dog Dander IgE: <0.1 kU/L
Elm, American IgE: <0.1 kU/L
Hickory, White IgE: <0.1 kU/L
IgE (Immunoglobulin E), Serum: 83 IU/mL (ref 6–495)
Johnson Grass IgE: <0.1 kU/L
Maple/Box Elder IgE: <0.1 kU/L
Mucor Racemosus IgE: <0.1 kU/L
Mugwort IgE Qn: <0.1 kU/L
Nettle IgE: <0.1 kU/L
Oak, White IgE: <0.1 kU/L
Penicillium Chrysogen IgE: <0.1 kU/L
Pigweed, Rough IgE: <0.1 kU/L
Plantain, English IgE: <0.1 kU/L
Ragweed, Short IgE: <0.1 kU/L
Sheep Sorrel IgE Qn: <0.1 kU/L
Stemphylium Herbarum IgE: <0.1 kU/L
Sweet gum IgE RAST Ql: <0.1 kU/L
Timothy Grass IgE: <0.1 kU/L
White Mulberry IgE: <0.1 kU/L

## 2019-12-07 LAB — ALLERGEN FIRE ANT: I070-IgE Fire Ant (Invicta): 4.97 kU/L — AB

## 2019-12-07 LAB — ALLERGEN, KIWI FRUIT, F84: Kiwi Fruit: <0.1 kU/L

## 2020-01-31 ENCOUNTER — Ambulatory Visit: Payer: Medicaid Other | Admitting: Allergy & Immunology

## 2020-02-11 ENCOUNTER — Emergency Department (HOSPITAL_COMMUNITY): Payer: Medicaid Other

## 2020-02-11 ENCOUNTER — Emergency Department (HOSPITAL_COMMUNITY)
Admission: EM | Admit: 2020-02-11 | Discharge: 2020-02-11 | Disposition: A | Discharge status: Home / Self Care | Payer: Medicaid Other | Attending: Emergency Medicine | Admitting: Emergency Medicine

## 2020-02-11 ENCOUNTER — Other Ambulatory Visit: Payer: Self-pay

## 2020-02-11 ENCOUNTER — Encounter (HOSPITAL_COMMUNITY): Payer: Self-pay

## 2020-02-11 DIAGNOSIS — M25512 Pain in left shoulder: Secondary | ICD-10-CM | POA: Diagnosis present

## 2020-02-11 DIAGNOSIS — R519 Headache, unspecified: Secondary | ICD-10-CM | POA: Diagnosis not present

## 2020-02-11 DIAGNOSIS — R0789 Other chest pain: Secondary | ICD-10-CM | POA: Diagnosis not present

## 2020-02-11 DIAGNOSIS — Z5321 Procedure and treatment not carried out due to patient leaving prior to being seen by health care provider: Secondary | ICD-10-CM | POA: Insufficient documentation

## 2020-02-11 HISTORY — DX: Autoimmune thyroiditis: E06.3

## 2020-02-11 MED ORDER — ONDANSETRON 4 MG PO TBDP
4.0000 mg | ORAL_TABLET | Freq: Once | ORAL | Status: AC
  Administered 2020-02-11: 4 mg via ORAL
  Filled 2020-02-11: qty 1

## 2020-02-11 MED ORDER — SODIUM CHLORIDE 0.9% FLUSH: 3.0000 mL | Freq: Once | INTRAVENOUS | Status: DC

## 2020-02-11 NOTE — ED Triage Notes (Signed)
Patient reports that she began having chest pressure, left shoulder pain, headache approx 1 1/2 hours ago.

## 2020-03-19 ENCOUNTER — Other Ambulatory Visit: Payer: Self-pay

## 2020-03-19 ENCOUNTER — Encounter (HOSPITAL_COMMUNITY): Payer: Self-pay

## 2020-03-19 DIAGNOSIS — Z5321 Procedure and treatment not carried out due to patient leaving prior to being seen by health care provider: Secondary | ICD-10-CM | POA: Diagnosis not present

## 2020-03-19 DIAGNOSIS — R946 Abnormal results of thyroid function studies: Secondary | ICD-10-CM | POA: Insufficient documentation

## 2020-03-19 DIAGNOSIS — R1110 Vomiting, unspecified: Secondary | ICD-10-CM | POA: Insufficient documentation

## 2020-03-19 NOTE — ED Triage Notes (Signed)
Patient reports that her TSH is 67.8 and was called by her physician and told to come to the ED. Patient also c/o emesis.

## 2020-03-20 ENCOUNTER — Emergency Department (HOSPITAL_COMMUNITY)
Admission: EM | Admit: 2020-03-20 | Discharge: 2020-03-20 | Disposition: A | Discharge status: Home / Self Care | Payer: Medicaid Other | Attending: Emergency Medicine | Admitting: Emergency Medicine

## 2020-03-20 NOTE — ED Notes (Signed)
Explained/advised pt of extended wait times, thanked pt for patience and ensured will be seen as soon as possible. Pt confirmed/acknowledged understanding. ENMiles 

## 2020-04-15 IMAGING — CT CT HEAD WITHOUT CONTRAST
3 series · 16 of 47 positions shown, 19 images · non-contrast
Comparison: None.

CLINICAL DATA: Right-sided headache beginning this morning.
Intermittent emesis since 01/26/2019

EXAM:
CT HEAD WITHOUT CONTRAST
TECHNIQUE: Contiguous axial images were obtained from the base of the skull
through the vertex without intravenous contrast.

[Series 2: head wo · axial · 0.47mm/px · z∈[-99,+31]mm · 10 of 32 slices shown, 13 images]
[im 3/32  brain]
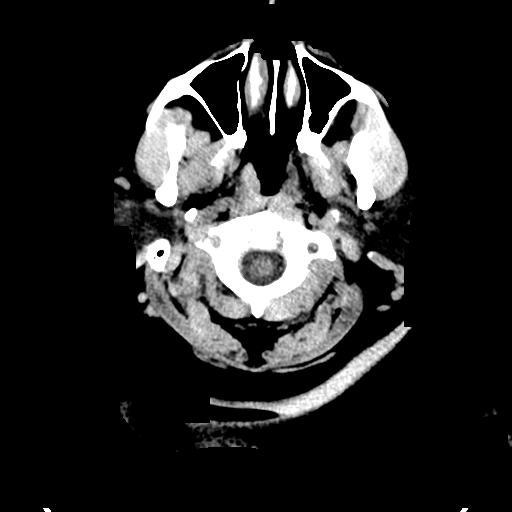
[im 3/32  bone]
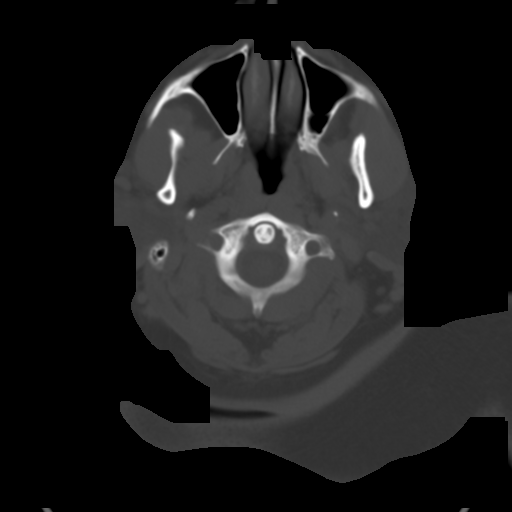
[im 6/32  brain]
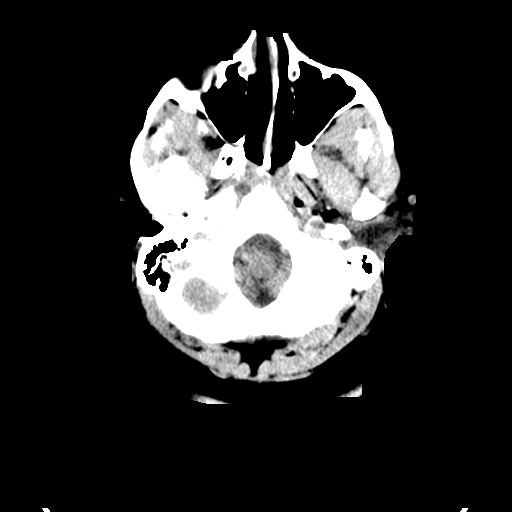
[im 9/32  brain]
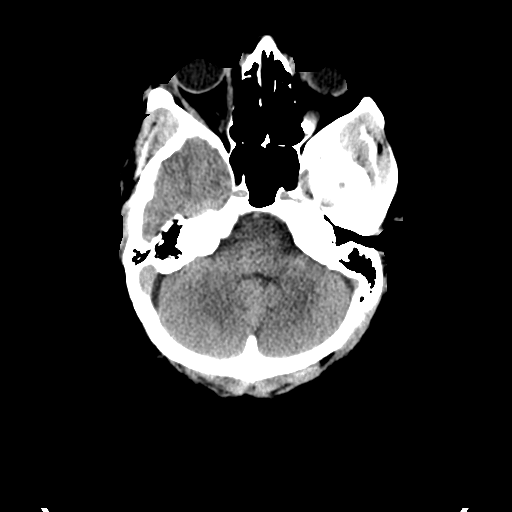
[im 11/32  brain]
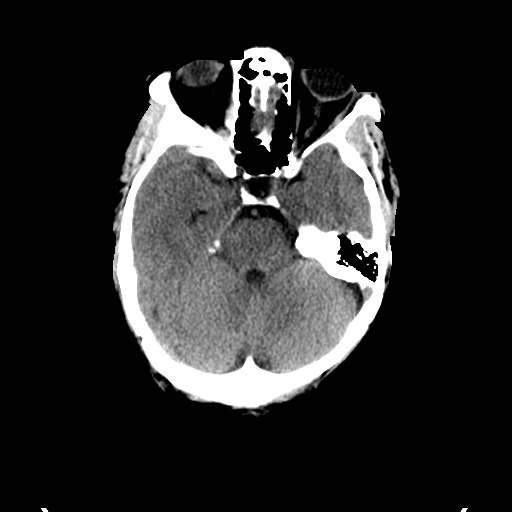
[im 14/32  brain]
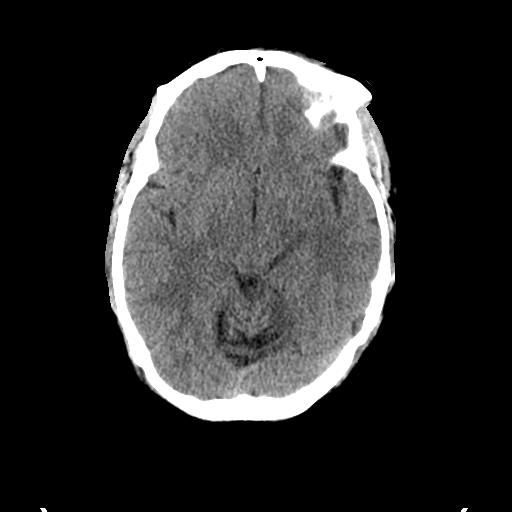
[im 14/32  bone]
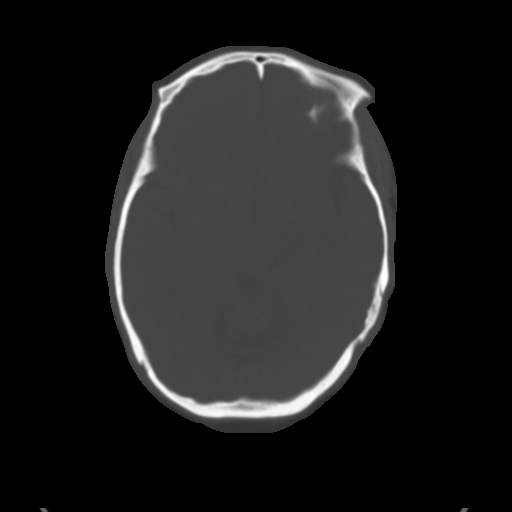
[im 18/32  brain]
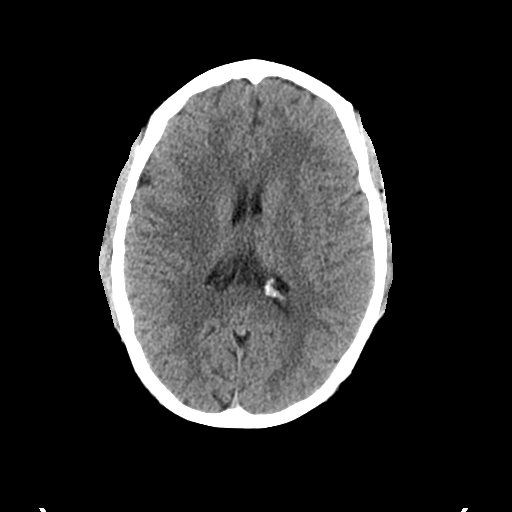
[im 21/32  brain]
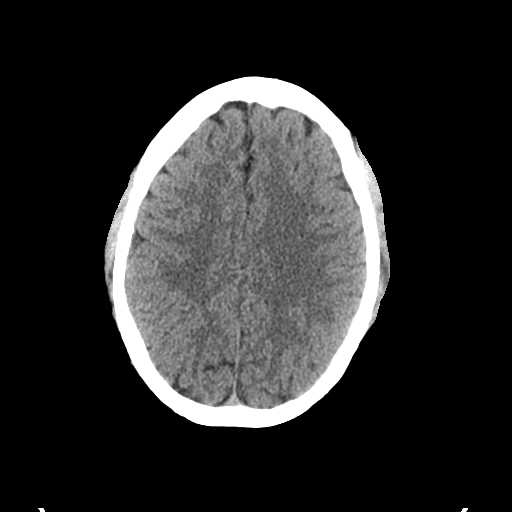
[im 24/32  brain]
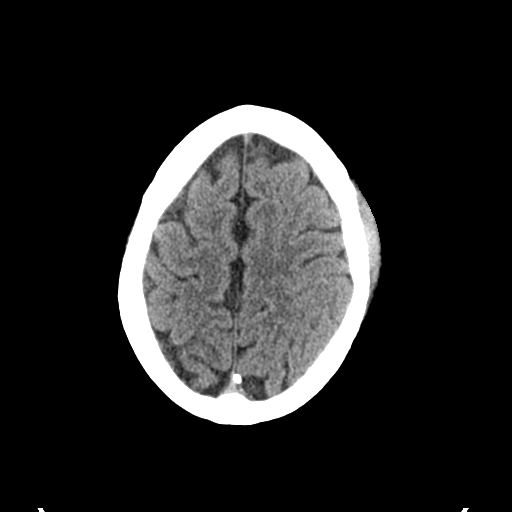
[im 26/32  brain]
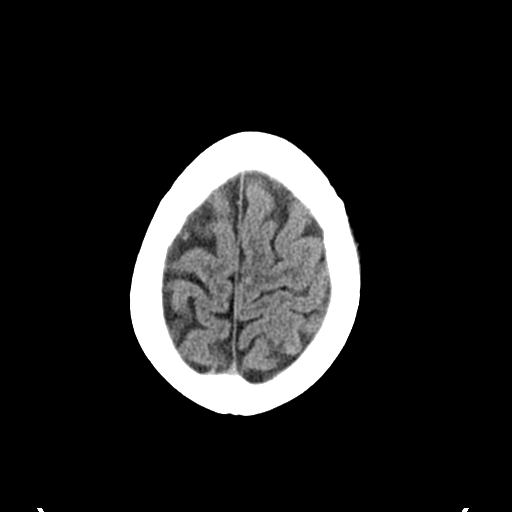
[im 26/32  bone]
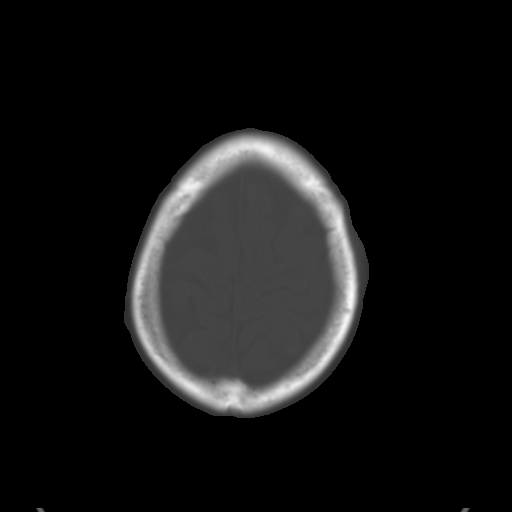
[im 29/32  brain]
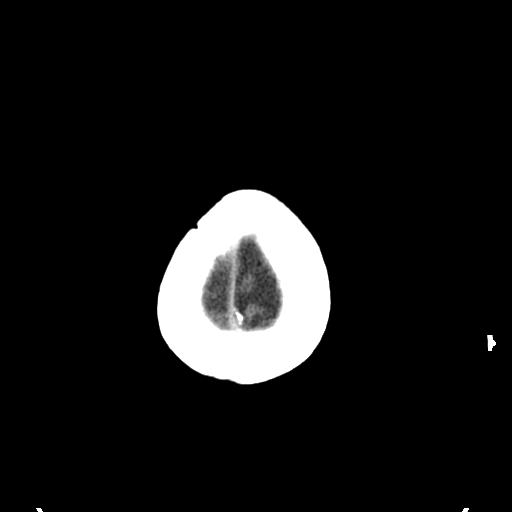

[Series 4: coronal soft tissue · coronal · 0.31mm/px · 3 of 64 slices shown]
[im 22/64  brain]
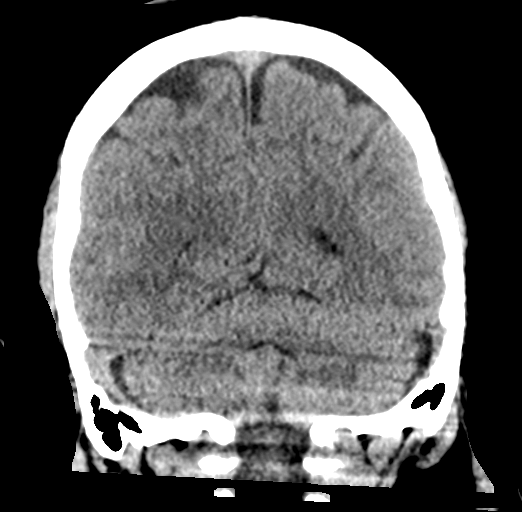
[im 29/64  brain]
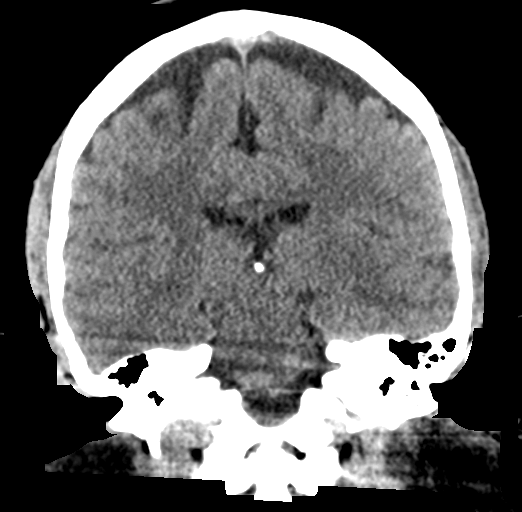
[im 36/64  brain]
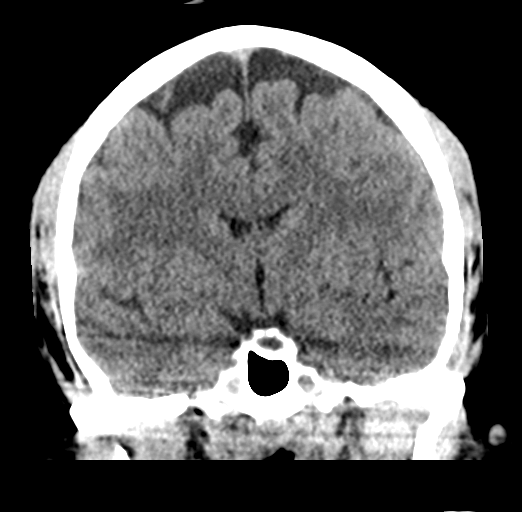

[Series 5: sagittal soft tissue · sagittal · 0.31mm/px · 3 of 54 slices shown]
[im 18/54  brain]
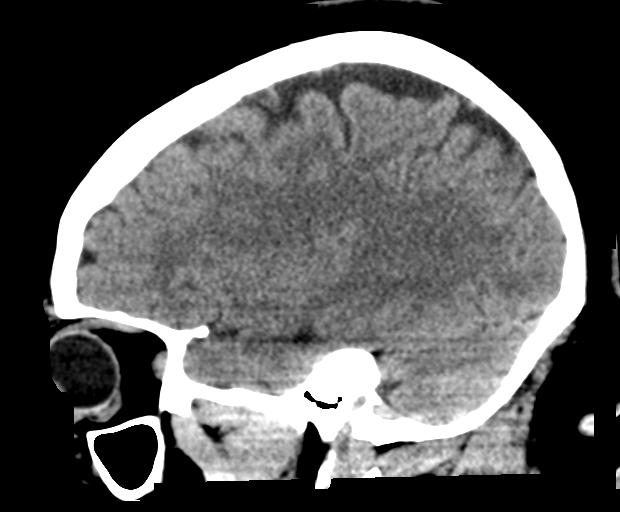
[im 27/54  brain]
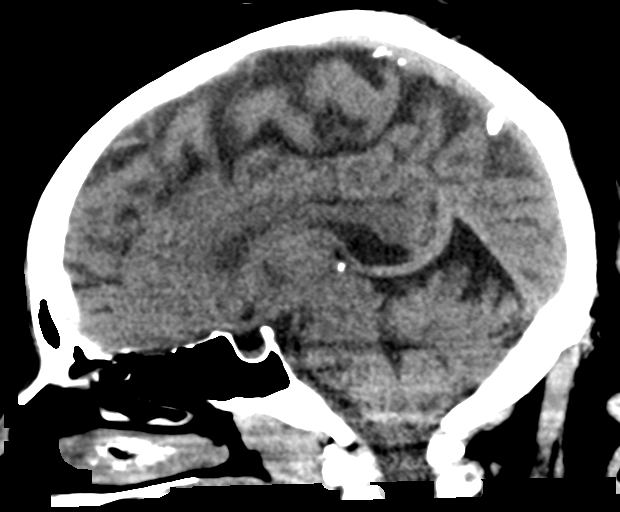
[im 36/54  brain]
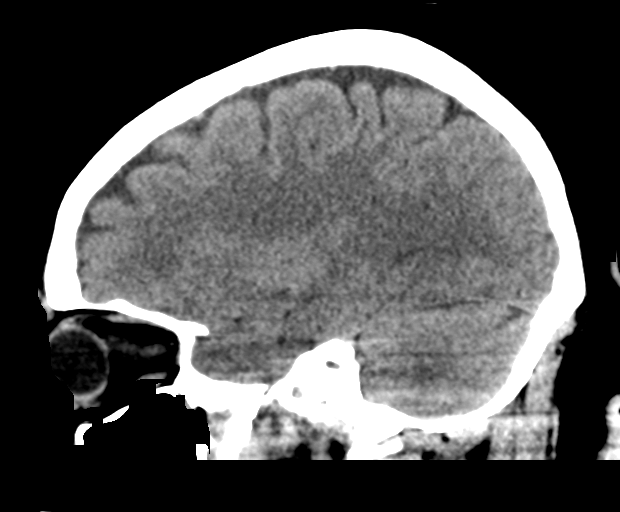

[16 of 47 positions shown; findings below may reference images not displayed]

FINDINGS: Brain: Mild age advanced atrophy with sulcal prominence. Gray-white
differentiation is maintained. No CT evidence of acute large
territory infarct. No intraparenchymal or extra-axial mass or
hemorrhage. Normal size and configuration of the ventricles and the
basilar cisterns. No midline shift.

Vascular: No hyperdense vessel or unexpected calcification.

Skull: No displaced calvarial fracture.

Sinuses/Orbits: There is underpneumatization the bilateral frontal
sinuses. The remaining paranasal sinuses and mastoid air cells are
normally aerated. No air-fluid levels.

Other: Regional soft tissues appear normal.
IMPRESSION: Mild age advanced atrophy.  Otherwise, negative noncontrast head CT.

## 2020-06-07 ENCOUNTER — Other Ambulatory Visit: Payer: Self-pay

## 2020-06-07 ENCOUNTER — Other Ambulatory Visit: Payer: Self-pay | Admitting: *Deleted

## 2020-06-07 NOTE — Patient Instructions (Signed)
Visit Information  Mr. Honeycutt was given information about Medicaid Managed Care team care coordination services as a part of their Healthy [facility] [employer] Medicaid benefit. Nesha Counihan verbally consented to engagement with the [facility] Managed Care team.   For questions related to your [facility] health plan, please call: [phone]  If you would like to schedule transportation through your [facility] plan, please call the following number at least 2 days in advance of your appointment: [phone]  Goals Addressed              This Visit's Progress   .  "I want to improve my Hgb A1C" (pt-stated)        CARE PLAN ENTRY Medicaid Managed Care (see longtitudinal plan of care for additional care plan information)  Objective:  . No results found for: HGBA1C Lab Results  Component Value Date   CREATININE 0.57 11/18/2019   CREATININE 0.73 02/17/2019 .   Marland Kitchen HgbA1C found in Care Everywhere from 05/10/20 was 8.9 . Patient reported cbg findings: fasting 230-250, postprandial 180-376  Current Barriers:  Marland Kitchen Knowledge Deficits related to basic Diabetes pathophysiology and self care/management. Patient reports that it has been a long time since she had diabetes education. She would like to improve her readings and is interested in information on diabetic diet. Patient would like next HgbA1C to be 8 or less.  Case Manager Clinical Goal(s):  Marland Kitchen Over the next 30 days, patient will demonstrate improved adherence to prescribed treatment plan for diabetes self care/management as evidenced by:  . daily monitoring and recording of CBG and report any findings over 350 to MD  . adherence to ADA/ carb modified diet . exercise 7 days/week . adherence to prescribed medication regimen  Interventions:  . Provided education to patient about basic DM disease process . Reviewed medications with patient and discussed importance of medication adherence . Discussed plans with patient for ongoing care  management follow up and provided patient with direct contact information for care management team . Reviewed scheduled/upcoming provider appointments including: PCP 06/14/20 . Advised patient, providing education and rationale, to check cbg fasting and 2 hours after the first bite of food of each meal and record, calling Dr. Derrell Lolling for findings outside established parameters. Encouraged patient to eat a protein rich snack before bedtime. Marland Kitchen Referral made to pharmacy team for assistance with medication review  Patient Self Care Activities:  . Self administers oral medications as prescribed . Attends all scheduled provider appointments . Checks blood sugars as prescribed and utilize hyper and hypoglycemia protocol as needed . Adheres to prescribed ADA/carb modified . Exercise daily  Initial goal documentation        Please see education materials related to Diabetes provided by e-mail link.  Patient verbalizes understanding of instructions provided today.   The patient has been provided with contact information for the Managed Medicaid care management team and has been advised to call with any health related questions or concerns.  Telephone follow up appointment with Managed Medicaid care management team member scheduled for:  [name] RN, BSN [place]  [facility] RN Care Coordinator

## 2020-06-07 NOTE — Patient Outreach (Addendum)
Care Coordination - Case Manager  06/07/2020  Pamela Rivers Jan 14, 1981 [id number]  Subjective:  Pamela Rivers is an 39 y.o. year old adult who is a primary patient of Dr. Synetta Fail.  Pamela Rivers was given information about Medicaid Managed Care team care coordination services today. Pamela Rivers agreed to services and verbal consent obtained  Review of patient status, laboratory and other test data was performed as part of evaluation for provision of services.  SDOH: SDOH Screenings   Tobacco Use: High Risk  . Smoking Tobacco Use: Current Every Day Smoker  . Smokeless Tobacco Use: Never Used  Transportation Needs:   . Freight forwarder (Medical): Not on file  . Lack of Transportation (Non-Medical): Not on file     Objective:    Allergies  Allergen Reactions  . Other     Tomato   . Vitamin D Analogs     Medications:    Medications Reviewed Today    Reviewed by Heidi Dach, RN (Registered Nurse) on 06/07/20 at 1510  Med List Status: <None>  Medication Order Taking? Sig Documenting Provider Last Dose Status Informant  baclofen (LIORESAL) 10 MG tablet [id number] No Take 10 mg by mouth 3 (three) times daily.  Patient not taking: Reported on 06/07/2020   [provider] Not Taking Active   cetirizine (ZYRTEC) 10 MG tablet [id number] No Take 1 tablet (10 mg total) by mouth daily.  Patient not taking: Reported on 06/07/2020   Alfonse Spruce, MD Not Taking Active   escitalopram (LEXAPRO) 20 MG tablet [id number] Yes Take 20 mg by mouth daily. [provider] Taking Active   fluticasone (FLONASE) 50 MCG/ACT nasal spray [id number] Yes Place 1 spray into both nostrils daily. Alfonse Spruce, MD Taking Active   glimepiride [facility]) 4 MG tablet [id number] Yes Take 4 mg by mouth daily with breakfast. [provider] Taking Active   levothyroxine (SYNTHROID) 150 MCG tablet [id number] Yes Take 150 mcg by mouth daily before  breakfast. [provider] Taking Active   losartan (COZAAR) 50 MG tablet [id number] Yes Take 50 mg by mouth daily. [provider] Taking Active   pioglitazone (ACTOS) 30 MG tablet [id number] Yes Take 30 mg by mouth daily. [provider] Taking Active   rosuvastatin (CRESTOR) 10 MG tablet [id number] Yes Take 20 mg by mouth daily.  [provider] Taking Active   sitaGLIPtin (JANUVIA) 50 MG tablet [id number] Yes Take 50 mg by mouth daily. [provider] Taking Active           Assessment:   Goals Addressed              This Visit's Progress   .  "I want to improve my Hgb A1C" (pt-stated)        CARE PLAN ENTRY Medicaid Managed Care (see longtitudinal plan of care for additional care plan information)  Objective:  . No results found for: HGBA1C Lab Results  Component Value Date   CREATININE 0.57 11/18/2019   CREATININE 0.73 02/17/2019 .   Marland Kitchen HgbA1C found in Care Everywhere from 05/10/20 was 8.9 . Patient reported cbg findings: fasting 230-250, postprandial 180-376  Current Barriers:  Marland Kitchen Knowledge Deficits related to basic Diabetes pathophysiology and self care/management. Patient reports that it has been a long time since she had diabetes education. She would like to improve her readings and is interested in information on diabetic diet. Patient would like next HgbA1C to be 8 or less.  Case Manager Clinical  Goal(s):  Marland Kitchen Over the next 30 days, patient will demonstrate improved adherence to prescribed treatment plan for diabetes self care/management as evidenced by:  . daily monitoring and recording of CBG and report any findings over 350 to MD  . adherence to ADA/ carb modified diet . exercise 7 days/week . adherence to prescribed medication regimen  Interventions:  . Provided education to patient about basic DM disease process . Reviewed medications with patient and discussed importance of medication adherence . Discussed plans with  patient for ongoing care management follow up and provided patient with direct contact information for care management team . Reviewed scheduled/upcoming provider appointments including: PCP 06/14/20 . Advised patient, providing education and rationale, to check cbg fasting and 2 hours after the first bite of food of each meal and record, calling Dr. Derrell Lolling for findings outside established parameters. Encouraged patient to eat a protein rich snack before bedtime. Marland Kitchen Referral made to pharmacy team for assistance with medication review  Patient Self Care Activities:  . Self administers oral medications as prescribed . Attends all scheduled provider appointments . Checks blood sugars as prescribed and utilize hyper and hypoglycemia protocol as needed . Adheres to prescribed ADA/carb modified . Exercise daily  Initial goal documentation        Plan: Follow up phone call with [facility] 07/09/20 @ 9am  [name] RN, BSN [place]  [facility]

## 2020-06-11 ENCOUNTER — Other Ambulatory Visit: Payer: Self-pay

## 2020-06-12 ENCOUNTER — Other Ambulatory Visit: Payer: Self-pay | Admitting: *Deleted

## 2020-06-12 NOTE — Patient Outreach (Signed)
Care Coordination  06/12/2020  Pamela Rivers [birth year] [id number]   An unsuccessful telephone outreach was attempted today. The patient was referred to the case management team for assistance with care management and care coordination.    Pamela Rivers was referred to [facility] by [facility] Pharmacist. A visit was requested for today. RNCM attempted to reach the patient by both numbers listed in the chart. No answer, no option to leave a voice mail.  Follow Up Plan: The Managed Medicaid care management team will reach out to the patient again over the next 7 days.  The patient has been provided with contact information for the Managed Medicaid care management team and has been advised to call with any health related questions or concerns.   [name] RN, BSN [place]  [facility] [employer]

## 2020-06-12 NOTE — Patient Instructions (Addendum)
Visit Information  [name]  - as a part of your Medicaid benefit, you are eligible for care management and care coordination services at no cost or copay. I was unable to reach you by phone today but would be happy to help you with your health related needs. Please feel free to call me @ [phone].   A member of the Managed Medicaid care management team will reach out to you again over the next 7 days.   Maximillion Gill RN, BSN [name]  [facility] RN Care Coordinator   

## 2020-06-18 ENCOUNTER — Other Ambulatory Visit: Payer: Self-pay | Admitting: *Deleted

## 2020-06-18 NOTE — Patient Outreach (Signed)
Care Coordination  06/18/2020  Pamela Rivers 05-18-1981 [id number]  A second unsuccessful telephone outreach was attempted today. The patient was referred to the case management team for assistance with care management and care coordination.   Follow Up Plan: A HIPAA compliant phone message was left for the patient providing contact information and requesting a return call.  Telephone follow up appointment with Managed Medicaid care management team member scheduled for:07/09/20  [name] RN, BSN [place]  [facility] RN Care Coordinator

## 2020-06-18 NOTE — Patient Instructions (Signed)
Visit Information  Mr. Lujean Ebright  - as a part of your Medicaid benefit, you are eligible for care management and care coordination services at no cost or copay. I was unable to reach you by phone today but would be happy to help you with your health related needs. Please feel free to call me @ [phone].   A member of the Managed Medicaid care management team will reach out to you again on 07/09/20.   [name] RN, BSN   [facility] [employer]

## 2020-07-09 ENCOUNTER — Other Ambulatory Visit: Payer: Self-pay | Admitting: *Deleted

## 2020-07-09 ENCOUNTER — Other Ambulatory Visit: Payer: Self-pay

## 2020-07-09 DIAGNOSIS — E039 Hypothyroidism, unspecified: Secondary | ICD-10-CM

## 2020-07-09 DIAGNOSIS — E119 Type 2 diabetes mellitus without complications: Secondary | ICD-10-CM

## 2020-07-09 HISTORY — DX: Type 2 diabetes mellitus without complications: E11.9

## 2020-07-09 HISTORY — DX: Hypothyroidism, unspecified: E03.9

## 2020-07-09 NOTE — Patient Instructions (Addendum)
Visit Information  Pamela Rivers was given information about Medicaid Managed Care team care coordination services as a part of their [facility] Medicaid benefit. Pamela Rivers verbally consented to engagement with the [facility] Managed Care team.   For questions related to your [facility] health plan, please call: [phone]  If you would like to schedule transportation through your [facility] plan, please call the following number at least 2 days in advance of your appointment: [phone]  Goals Addressed              This Visit's Progress   .  "I want to improve my Hgb A1C" (pt-stated)        CARE PLAN ENTRY Medicaid Managed Care (see longtitudinal plan of care for additional care plan information)  Objective:  . No results found for: HGBA1C Lab Results  Component Value Date   CREATININE 0.57 11/18/2019   CREATININE 0.73 02/17/2019 .   Marland Kitchen HgbA1C found in Care Everywhere from 05/10/20 was 8.9 . Patient reported cbg findings: fasting 160-180, postprandial 180-230  Current Barriers:  Marland Kitchen Knowledge Deficits related to basic Diabetes pathophysiology and self care/management. Patient reports that it has been a long time since she had diabetes education. She would like to improve her readings and is interested in information on diabetic diet. Patient would like next HgbA1C to be 8 or less. Patient doing better with meal choices, but continues to eat fast food due to busy schedule.   Case Manager Clinical Goal(s):  Marland Kitchen Over the next 30 days, patient will demonstrate improved adherence to prescribed treatment plan for diabetes self care/management as evidenced by:  . daily monitoring and recording of CBG and report any findings over 350 to MD  . adherence to ADA/ carb modified diet . exercise 7 days/week-Met-patient reports exercising everyday . adherence to prescribed medication regimen  Interventions:  . Provided education to patient about basic DM disease  process . Provided Emmi Manager video on diabetic diet and my chart education on how to eat when away from home. . Reviewed medications with patient and discussed importance of medication adherence. Patient has questions about her diabetes medications, referral to [facility] Pharmacist placed . Discussed plans with patient for ongoing care management follow up and provided patient with direct contact information for care management team . Reviewed scheduled/upcoming provider appointments including: PCP 06/14/20-missed appt-rescheduled to 08/13/20, Endocrinologist 11/19/20 . Advised patient, providing education and rationale, to check cbg fasting and 2 hours after the first bite of food of each meal and record, calling Dr. Valora Piccolo for findings outside established parameters. Encouraged patient to eat a protein rich snack before bedtime. Marland Kitchen Referral made to pharmacy team for assistance with medication review . Appointment with [facility] for nutrition counseling 07/16/20  Patient Self Care Activities:  . Self administers oral medications as prescribed . Attends all scheduled provider appointments . Checks blood sugars as prescribed and utilize hyper and hypoglycemia protocol as needed . Adheres to prescribed ADA/carb modified . Exercise daily  Please see past updates related to this goal by clicking on the "Past Updates" button in the selected goal        Please see education materials related to diabetic diet provided by MyChart link.    Tips for Eating Away From Home If You Have Diabetes Controlling your blood sugar (glucose) levels can be challenging when you do not prepare your own meals. The following tips can help you manage your diabetes when you eat away from home. If you have  questions or if you need help, work with your health care provider or diet and nutrition specialist (dietitian). Planning ahead Plan ahead if you know you will be eating away from home: Try to eat your meals and snacks at  about the same time each day. If you know your meal is going to be later than normal, make sure you have a small snack. Being very hungry can cause you to make unhealthy food choices. Make a list of restaurants near you that offer healthy choices. If a restaurant has a carry-out menu, take the menu home and plan what you will order ahead of time. Look up the restaurant you want to eat at online. Many chain and fast-food restaurants list nutritional information online. Use this information to choose the healthiest options and to calculate how many carbohydrates will be in your meal. Use a carbohydrate-counting book or mobile app to look up the carbohydrate content and serving size of the foods you want to eat. Free foods A "free food" is any food or drink that has less than 5 grams of carbohydrates and less than 20 calories per serving. These food are high in fiber and nutrients and low in calories, carbohydrates, and fats. Free foods include: Non-starchy vegetables, such as carrots, broccoli, celery, lettuce, or green beans. Non-sugar drinks, such as water, unsweetened coffee, or unsweetened tea. Low-calorie salad dressings. Sugar-free gelatin. Starting meals with a salad full of vegetables is a healthy choice that includes a lot of free foods. Avoid high-calorie salad toppings like bacon, cheese, and high-fat dressings. Ask for your salad dressing to be served on the side so that you dip your fork in the dressing and then in the salad. This allows you to control how much dressing you eat and still get the flavor with every bite. Choices to control carbohydrates  Ask your server to take away the bread basket or chips from your table. Choose light yogurt or [place] yogurt instead of non-fat sweetened yogurt. Order fresh fruit. A salad bar often offers fresh fruit choices. Avoid canned fruit because it is usually packed in sugar or syrup. Order a salad, and ask for dressing on the side. Ask for  substitutes. For example, if your meal comes with french fries, ask for a side salad or steamed veggies instead. If a meal comes with fried chicken, ask for grilled chicken instead. Beverages Choose drinks that are low in calories and sugar, such as: Water. Unsweetened tea or coffee. Lowfat milk. Avoid the following drinks: Alcoholic beverages. Regular (not diet) sodas. Other tips If you take insulin, wait to take your insulin once your food arrives to your table. This will ensure that your insulin and your food are timed correctly. Become familiar with serving sizes and learn to recognize how many servings are in a portion. Restaurant portions are typically two to three times larger than what you really need. Ask your server for a to-go box at the beginning of the meal. When your food comes, leave the amount you should have on your plate, and put the rest in the to-go box so that you are not tempted to eat too much. Consider splitting an entree with someone and ordering a side salad. Avoid buffets. They are typically too tempting and result in overeating. Where to find more information American Diabetes Association: www.diabetes.org American Association of Diabetes Educators: www.diabeteseducator.org Summary Plan ahead when eating away from home. Try to eat your meals and snacks at about the same time each day. If  you know your meal is going to be later than normal, make sure you have a small snack. Being very hungry can cause you to make unhealthy food choices. Ask for substitutes. For example, if your meal comes with french fries, ask for a side salad or steamed veggies instead. If a meal comes with fried chicken, ask for grilled chicken instead. Ask for a to-go box when you order your meal. Divide your meal before you start eating. This information is not intended to replace advice given to you by your health care provider. Make sure you discuss any questions you have with your health care  provider. Document Revised: 11/26/2016 Document Reviewed: 11/26/2016 Elsevier Patient Education  2020 [employer].  Patient verbalizes understanding of instructions provided today.   Telephone follow up appointment with Managed Medicaid care management team member scheduled for:08/16/20 @ [place] RN, [place] RN Care Coordinator

## 2020-07-09 NOTE — Patient Outreach (Addendum)
Care Coordination - Case Manager  07/09/2020  Pamela Rivers Mar 05, 1981 [id number]  Subjective:  Pamela Rivers is an 39 y.o. year old adult who is a primary patient of Patient, No Pcp Per.  Pamela Rivers was given information about Medicaid Managed Care team care coordination services today. Pamela Rivers agreed to services and verbal consent obtained  Review of patient status, laboratory and other test data was performed as part of evaluation for provision of services.  SDOH: [place]:   . Last Housing Risk Score: Not on file  Tobacco Use: High Risk  . Smoking Tobacco Use: Current Every Day Smoker  . Smokeless Tobacco Use: Never Used  Transportation Needs:   . Film/video editor (Medical): Not on file  . Lack of Transportation (Non-Medical): Not on file   SDOH Interventions     Most Recent Value  SDOH Interventions  Transportation Interventions Intervention Not Indicated      Objective:    Allergies  Allergen Reactions  . Other     Tomato   . Vitamin D Analogs     Medications:    Medications Reviewed Today    Reviewed by Melissa Montane, RN (Registered Nurse) on 07/09/20 at [phone]  Med List Status: <None>  Medication Order Taking? Sig Documenting Provider Last Dose Status Informant  baclofen (LIORESAL) 10 MG tablet [id number] No Take 10 mg by mouth 3 (three) times daily.  Patient not taking: Reported on 06/07/2020   [provider] Not Taking Active   cetirizine (ZYRTEC) 10 MG tablet [id number] No Take 1 tablet (10 mg total) by mouth daily.  Patient not taking: Reported on 06/07/2020   [name], MD Not Taking Active   escitalopram (LEXAPRO) 20 MG tablet [id number] Yes Take 20 mg by mouth daily. [provider] Taking Active   fluticasone (FLONASE) 50 MCG/ACT nasal spray [id number] Yes Place 1 spray into both nostrils daily. [name], MD Taking Active   glimepiride [facility]) 4 MG tablet [id number]  Yes Take 4 mg by mouth daily with breakfast. [provider] Taking Active   levothyroxine (SYNTHROID) 150 MCG tablet [id number] Yes Take 150 mcg by mouth daily before breakfast. [provider] Taking Active   losartan (COZAAR) 50 MG tablet [id number] Yes Take 50 mg by mouth daily. [provider] Taking Active   pioglitazone (ACTOS) 30 MG tablet [id number] Yes Take 30 mg by mouth daily. [provider] Taking Active   rosuvastatin (CRESTOR) 10 MG tablet [id number] Yes Take 20 mg by mouth daily.  [provider] Taking Active   sitaGLIPtin (JANUVIA) 50 MG tablet [id number] No Take 50 mg by mouth daily.  Patient not taking: Reported on 07/09/2020   [provider] Not Taking Active            Med Note (Tonique Mendonca A   Mon Jul 09, 2020  9:22 AM) Patient stopped taking on her own          Assessment:   Goals Addressed              This Visit's Progress   .  "I want to improve my Hgb A1C" (pt-stated)        CARE PLAN ENTRY Medicaid Managed Care (see longtitudinal plan of care for additional care plan information)  Objective:  . No results found for: HGBA1C Lab Results  Component Value Date   CREATININE 0.57 11/18/2019   CREATININE 0.73 02/17/2019 .   Marland Kitchen HgbA1C found  in [place] from 05/10/20 was 8.9 . Patient reported cbg findings: fasting 160-180, postprandial 180-230  Current Barriers:  Marland Kitchen Knowledge Deficits related to basic Diabetes pathophysiology and self care/management. Patient reports that it has been a long time since she had diabetes education. She would like to improve her readings and is interested in information on diabetic diet. Patient would like next HgbA1C to be 8 or less. Patient doing better with meal choices, but continues to eat fast food due to busy schedule.   Case Manager Clinical Goal(s):  Marland Kitchen Over the next 30 days, patient will demonstrate improved adherence to prescribed treatment plan for diabetes  self care/management as evidenced by:  . daily monitoring and recording of CBG and report any findings over 350 to MD  . adherence to ADA/ carb modified diet . exercise 7 days/week-Met-patient reports exercising everyday . adherence to prescribed medication regimen  Interventions:  . Provided education to patient about basic DM disease process . Provided Emmi Manager video on diabetic diet and my chart education on how to eat when away from home. . Reviewed medications with patient and discussed importance of medication adherence. Patient has questions about her diabetes medications, referral to MM Pharmacist placed . Discussed plans with patient for ongoing care management follow up and provided patient with direct contact information for care management team . Reviewed scheduled/upcoming provider appointments including: PCP 06/14/20-missed appt-rescheduled to 08/13/20, Endocrinologist 11/19/20 . Advised patient, providing education and rationale, to check cbg fasting and 2 hours after the first bite of food of each meal and record, calling Dr. Valora Piccolo for findings outside established parameters. Encouraged patient to eat a protein rich snack before bedtime. Marland Kitchen Referral made to pharmacy team for assistance with medication review . Appointment with [facility] for nutrition counseling 07/16/20  Patient Self Care Activities:  . Self administers oral medications as prescribed . Attends all scheduled provider appointments . Checks blood sugars as prescribed and utilize hyper and hypoglycemia protocol as needed . Adheres to prescribed ADA/carb modified . Exercise daily  Please see past updates related to this goal by clicking on the "Past Updates" button in the selected goal        Plan: RNCM will follow up with a telephone visit on 08/16/20 @ [name], [place] RN Care Coordinator

## 2020-07-18 ENCOUNTER — Telehealth: Payer: Self-pay | Admitting: General Practice

## 2020-07-18 NOTE — Telephone Encounter (Signed)
Attempted to reach Pamela Rivers to schedule phone appt with the Managed Medicaid Pharmacist. Left message with my name and direct number. I will continue to reach out to this patient.

## 2020-07-29 ENCOUNTER — Ambulatory Visit (INDEPENDENT_AMBULATORY_CARE_PROVIDER_SITE_OTHER): Payer: Medicaid Other

## 2020-07-29 ENCOUNTER — Other Ambulatory Visit: Payer: Self-pay

## 2020-07-29 ENCOUNTER — Ambulatory Visit (HOSPITAL_COMMUNITY)
Admission: EM | Admit: 2020-07-29 | Discharge: 2020-07-29 | Disposition: A | Discharge status: Home / Self Care | Payer: Medicaid Other | Attending: Family Medicine | Admitting: Family Medicine

## 2020-07-29 ENCOUNTER — Encounter (HOSPITAL_COMMUNITY): Payer: Self-pay | Admitting: *Deleted

## 2020-07-29 DIAGNOSIS — W19XXXA Unspecified fall, initial encounter: Secondary | ICD-10-CM | POA: Diagnosis not present

## 2020-07-29 DIAGNOSIS — S59901A Unspecified injury of right elbow, initial encounter: Secondary | ICD-10-CM

## 2020-07-29 DIAGNOSIS — M25521 Pain in right elbow: Secondary | ICD-10-CM | POA: Diagnosis not present

## 2020-07-29 MED ORDER — DEXAMETHASONE SODIUM PHOSPHATE 10 MG/ML IJ SOLN
INTRAMUSCULAR | Status: AC
  Filled 2020-07-29: qty 1

## 2020-07-29 MED ORDER — DEXAMETHASONE SODIUM PHOSPHATE 10 MG/ML IJ SOLN
10.0000 mg | Freq: Once | INTRAMUSCULAR | Status: AC
  Administered 2020-07-29: 10 mg via INTRAMUSCULAR

## 2020-07-29 MED ORDER — TIZANIDINE HCL 4 MG PO TABS: 4.0000 mg | ORAL_TABLET | Freq: Four times a day (QID) | ORAL | 0 refills | Status: AC | PRN

## 2020-07-29 NOTE — Discharge Instructions (Addendum)
Recommend wearing immobilizer for elbow over the next few days and advancing activity as tolerated.  Remove immobilizer at least 2-3 times per day and perform range of motion exercises to prevent stiffening.  Ibuprofen 600 mg every  8 hours as needed for pain Tizanidine (causes drowsiness) given every 6-8 hours as needed for pain.  You did receive a Decadron injection here in clinic today which will help improve ability to increase activity with use of right upper extremity.  If your symptoms worsen or do not improve I have included information to follow-up with orthopedics.

## 2020-07-29 NOTE — ED Provider Notes (Signed)
[facility]    CSN: [id number] Arrival date & time: 07/29/20  1118      History   Chief Complaint Chief Complaint  Patient presents with  . Arm Injury    HPI Pamela Rivers is a 39 y.o. adult.   HPI Patient presents today for evaluation of an elbow injury. Patient sustained a fall landing on her right upper extremity.  Fall occurred x2 days ago.  Since the time of injury patient has had precipitating pain and limited extension of right upper extremity.  Denies previous injury or fracture to elbow. Denies any numbness and tingling involving the hand or fingers. Past Medical History:  Diagnosis Date  . Anxiety   . Depression   . Diabetes mellitus without complication (HCC)   . Fibromyalgia   . Hashimoto's disease   . Hypertension   . Hypothyroidism 07/09/2020  . Thyroid disease   . Type 2 diabetes mellitus (HCC) 07/09/2020    Patient Active Problem List   Diagnosis Date Noted  . Hypothyroidism 07/09/2020  . Type 2 diabetes mellitus (HCC) 07/09/2020  . 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 Flexural atopic dermatitis 12/03/2019  . Chronic rhinitis 12/03/2019  . Current smoker 12/03/2019    Past Surgical History:  Procedure Laterality Date  . ABDOMINAL HYSTERECTOMY    . BREAST SURGERY    . TONSILLECTOMY      OB History   No obstetric history on file.      Home Medications    Prior to Admission medications   Medication Sig Start Date End Date Taking? Authorizing Provider  glimepiride (AMARYL) 4 MG tablet Take 4 mg by mouth daily with breakfast.   Yes [provider]  levothyroxine (SYNTHROID) 150 MCG tablet Take 150 mcg by mouth daily before breakfast.   Yes [provider]  pioglitazone (ACTOS) 30 MG tablet Take 30 mg by mouth daily.   Yes [provider]  rosuvastatin (CRESTOR) 10 MG tablet Take 20 mg by mouth daily.    Yes [provider]  baclofen (LIORESAL) 10 MG tablet Take 10 mg  by mouth 3 (three) times daily. Patient not taking: Reported on 06/07/2020    [provider]  cetirizine (ZYRTEC) 10 MG tablet Take 1 tablet (10 mg total) by mouth daily. Patient not taking: Reported on 06/07/2020 12/01/19   Alfonse Spruce, MD  escitalopram (LEXAPRO) 20 MG tablet Take 20 mg by mouth daily.    [provider]  fluticasone (FLONASE) 50 MCG/ACT nasal spray Place 1 spray into both nostrils daily. 12/01/19   Alfonse Spruce, MD  losartan (COZAAR) 50 MG tablet Take 50 mg by mouth daily.    [provider]  sitaGLIPtin (JANUVIA) 50 MG tablet Take 50 mg by mouth daily. Patient not taking: Reported on 07/09/2020    [provider]    Family History Family History  Problem Relation Age of Onset  . Diabetes Mother   . Hypertension Mother   . Diabetes Father   . Hypertension Father     Social History Social History   Tobacco Use  . Smoking status: Current Every Day Smoker    Packs/day: 0.25    Types: Cigarettes  . Smokeless tobacco: Never Used  Vaping Use  . Vaping Use: Never used  Substance Use Topics  . Alcohol use: Yes    Comment: rarely  . Drug use: Yes    Types: Marijuana    Comment: mostly CBD  Allergies   Other and Vitamin d analogs  Review of Systems Review of Systems Pertinent negatives listed in HPI Physical Exam Triage Vital Signs ED Triage Vitals  Enc Vitals Group     BP 07/29/20 1227 105/81     Pulse Rate 07/29/20 1227 90     Resp 07/29/20 1227 16     Temp 07/29/20 1227 98.1 F (36.7 C)     Temp Source 07/29/20 1227 Oral     SpO2 07/29/20 1227 98 %     Weight --      Height --      Head Circumference --      Peak Flow --      Pain Score 07/29/20 1231 6     Pain Loc --      Pain Edu? --      Excl. in GC? --    No data found.  Updated Vital Signs BP 105/81   Pulse 90   Temp 98.1 F (36.7 C) (Oral)   Resp 16   SpO2 98%   Visual Acuity Right Eye Distance:   Left Eye Distance:     Bilateral Distance:    Right Eye Near:   Left Eye Near:    Bilateral Near:     Physical Exam Constitutional:      General: He is not in acute distress.    Appearance: He is obese. He is not ill-appearing, toxic-appearing or diaphoretic.  Cardiovascular:     Rate and Rhythm: Normal rate and regular rhythm.  Pulmonary:     Breath sounds: Normal breath sounds.  Musculoskeletal:     Right elbow: Swelling present. No deformity or effusion. Decreased range of motion. Tenderness present in medial epicondyle and lateral epicondyle.     Cervical back: Normal range of motion. No rigidity or tenderness.  Skin:    Capillary Refill: Capillary refill takes less than 2 seconds.  Neurological:     General: No focal deficit present.     Mental Status: He is alert and oriented to person, place, and time.  Psychiatric:        Mood and Affect: Mood normal.        Behavior: Behavior normal.        Thought Content: Thought content normal.        Judgment: Judgment normal.    UC Treatments / Results  Labs (all labs ordered are listed, but only abnormal results are displayed) Labs Reviewed - No data to display  EKG Radiology DG Elbow Complete Right  Result Date: 07/29/2020 CLINICAL DATA:  Fall 2 days ago. EXAM: RIGHT ELBOW - COMPLETE 3+ VIEW COMPARISON:  None. FINDINGS: No joint effusion identified. No fracture or dislocation identified. Soft tissues are unremarkable. No significant arthropathy. IMPRESSION: Negative. Electronically Signed   By: Signa Kell M.D.   On: 07/29/2020 12:59    Procedures Procedures (including critical care time)  Medications Ordered in UC Medications - No data to display  Initial Impression / Assessment and Plan / UC Course  I have reviewed the triage vital signs and the nursing notes.  Pertinent labs & imaging results that were available during my care of the patient were reviewed by me and considered in my medical decision making (see chart for  details).    Injury sustained to right elbow x2 days ago.  Imaging today is negative for any acute fracture.  Will manage pain tizanidine 4 mg every 6 hours as needed for pain.  Patient also given instructions  to follow-up with orthopedics if symptoms worsen or do not improve.  Final Clinical Impressions(s) / UC Diagnoses   Final diagnoses:  Injury of right elbow, initial encounter   Discharge Instructions   None    ED Prescriptions    Medication Sig Dispense Auth. Provider   tiZANidine (ZANAFLEX) 4 MG tablet Take 1 tablet (4 mg total) by mouth every 6 (six) hours as needed for muscle spasms. 30 tablet Bing Neighbors, FNP     PDMP not reviewed this encounter.   Bing Neighbors, [place] 08/04/20 [phone]

## 2020-07-29 NOTE — ED Triage Notes (Signed)
Reports slipping and falling onto RUE 2 days ago.  C/O discomfort to right elbow area.  Pt able to have full ROM to RUE, but c/o tenderness to area, and slightly painful rotation of arm.

## 2020-08-09 ENCOUNTER — Other Ambulatory Visit: Payer: Self-pay

## 2020-08-09 NOTE — Patient Instructions (Addendum)
Visit Information  [name] was given information about Medicaid Managed Care team care coordination services as a part of their [facility] Medicaid benefit. Pamela Rivers verbally consented to engagement with the [facility] Managed Care team.   For questions related to your [facility] health plan, please call: [phone]  If you would like to schedule transportation through your [facility] plan, please call the following number at least 2 days in advance of your appointment: [phone]  Goals Addressed            This Visit's Progress   . Medication Management       Medicaid Managed Care CARE PLAN ENTRY (see longitudinal plan of care for additional care plan information)  Current Barriers:  . Social, financial, community barriers: Patient is in pain, patient uses ER instead of visiting PCP's office . Patient with complex health conditions multiple comorbidities including DM, Hypertension, Cholesterol . Self-manages medications. Does not use a pill box or other adherence strategies . Pharmacy  [id number] [place], [place] - [phone] [place] [address] Phone: [phone] Fax: [phone]     Pharmacist Clinical Goal(s):  Marland Kitchen Over the next 14 days, patient will work with PharmD and provider towards optimized medication management  Interventions: . Comprehensive medication review performed; medication list updated in electronic medical record . Inter-disciplinary care team collaboration (see longitudinal plan of care)  BP  BP Readings from Last 3 Encounters:  07/29/20 105/81  03/20/20 (!) 166/114  12/01/19 132/90    Losartan 50 Plan: At goal,  patient stable/ symptoms controlled  Thyroid Lab Results  Component Value Date   TSH 2.048 02/17/2019    Levothyroxine Plan: At goal,  patient stable/ symptoms controlled   DM -Sugars in 300's No results found for: HGBA1C  Glimepiride  Pioglitazone Tried/Failed:  Sitagliptin (Allergy/diarrhea) Plan: DC Sitagliptin and add to allergy list. Contact PCP and ask for Metformin ER trial. F/U 14 days  Mood No flowsheet data found.  Escitalopram Plan: Focused on sugars today, will f/u on this at next visit  Cholesterol No results found for: CHOL No results found for: HDL No results found for: LDLCALC No results found for: TRIG No results found for: CHOLHDL No results found for: LDLDIRECT  Rosuvastatin 10mg  Plan: Focused on sugars today, will try to increase statin at later visit  Pain Tizanidine 4mg  QID Plan: focused on sugars today, will go over pain at f/u    Patient Self Care Activities:  . Patient will take medications as prescribed . Patient will focus on improved adherence by December 22nd   Initial goal documentation  , Pharm.D., Managed Medicaid Pharmacist - [phone]        There are no care plans to display for this patient.   Please see education materials related to DM provided as print materials.   Patient verbalizes understanding of instructions provided today.   The Managed Medicaid care management team will reach out to the patient again over the next 14 days.   Artelia Laroche, [facility]  Diabetes Basics  Diabetes (diabetes mellitus) is a long-term (chronic) disease. It occurs when the body does not properly use sugar (glucose) that is released from food after you eat. Diabetes may be caused by one or both of these problems:  Your pancreas does not make enough of a hormone called insulin.  Your body does not react in a normal way to insulin that it makes. Insulin lets sugars (glucose) go into cells in  your body. This gives you energy. If you have diabetes, sugars cannot get into cells. This causes high blood sugar (hyperglycemia). Follow these instructions at home: How is diabetes treated? You may need to take insulin or other diabetes medicines daily to keep your blood sugar in balance. Take your  diabetes medicines every day as told by your doctor. List your diabetes medicines here: Diabetes medicines  Name of medicine: ______________________________ ? Amount (dose): _______________ Time (a.m./p.m.): _______________ Notes: ___________________________________  Name of medicine: ______________________________ ? Amount (dose): _______________ Time (a.m./p.m.): _______________ Notes: ___________________________________  Name of medicine: ______________________________ ? Amount (dose): _______________ Time (a.m./p.m.): _______________ Notes: ___________________________________ If you use insulin, you will learn how to give yourself insulin by injection. You may need to adjust the amount based on the food that you eat. List the types of insulin you use here: Insulin  Insulin type: ______________________________ ? Amount (dose): _______________ Time (a.m./p.m.): _______________ Notes: ___________________________________  Insulin type: ______________________________ ? Amount (dose): _______________ Time (a.m./p.m.): _______________ Notes: ___________________________________  Insulin type: ______________________________ ? Amount (dose): _______________ Time (a.m./p.m.): _______________ Notes: ___________________________________  Insulin type: ______________________________ ? Amount (dose): _______________ Time (a.m./p.m.): _______________ Notes: ___________________________________  Insulin type: ______________________________ ? Amount (dose): _______________ Time (a.m./p.m.): _______________ Notes: ___________________________________ How do I manage my blood sugar?  Check your blood sugar levels using a blood glucose monitor as directed by your doctor. Your doctor will set treatment goals for you. Generally, you should have these blood sugar levels:  Before meals (preprandial): 80-130 mg/dL (1.9-3.7 mmol/L).  After meals (postprandial): below 180 mg/dL (10 mmol/L).  A1c level:  less than 7%. Write down the times that you will check your blood sugar levels: Blood sugar checks  Time: _______________ Notes: ___________________________________  Time: _______________ Notes: ___________________________________  Time: _______________ Notes: ___________________________________  Time: _______________ Notes: ___________________________________  Time: _______________ Notes: ___________________________________  Time: _______________ Notes: ___________________________________  What do I need to know about low blood sugar? Low blood sugar is called hypoglycemia. This is when blood sugar is at or below 70 mg/dL (3.9 mmol/L). Symptoms may include:  Feeling: ? Hungry. ? Worried or nervous (anxious). ? Sweaty and clammy. ? Confused. ? Dizzy. ? Sleepy. ? Sick to your stomach (nauseous).  Having: ? A fast heartbeat. ? A headache. ? A change in your vision. ? Tingling or no feeling (numbness) around the mouth, lips, or tongue. ? Jerky movements that you cannot control (seizure).  Having trouble with: ? Moving (coordination). ? Sleeping. ? Passing out (fainting). ? Getting upset easily (irritability). Treating low blood sugar To treat low blood sugar, eat or drink something sugary right away. If you can think clearly and swallow safely, follow the 15:15 rule:  Take 15 grams of a fast-acting carb (carbohydrate). Talk with your doctor about how much you should take.  Some fast-acting carbs are: ? Sugar tablets (glucose pills). Take 3-4 glucose pills. ? 6-8 pieces of hard candy. ? 4-6 oz (120-150 mL) of fruit juice. ? 4-6 oz (120-150 mL) of regular (not diet) soda. ? 1 Tbsp (15 mL) honey or sugar.  Check your blood sugar 15 minutes after you take the carb.  If your blood sugar is still at or below 70 mg/dL (3.9 mmol/L), take 15 grams of a carb again.  If your blood sugar does not go above 70 mg/dL (3.9 mmol/L) after 3 tries, get help right away.  After your  blood sugar goes back to normal, eat a meal or a snack within 1 hour. Treating very low blood sugar  If your blood sugar is at or below 54 mg/dL (3 mmol/L), you have very low blood sugar (severe hypoglycemia). This is an emergency. Do not wait to see if the symptoms will go away. Get medical help right away. Call your local emergency services (911 in the [place] Do not drive yourself to the hospital. Questions to ask your health care provider  Do I need to meet with a diabetes educator?  What equipment will I need to care for myself at home?  What diabetes medicines do I need? When should I take them?  How often do I need to check my blood sugar?  What number can I call if I have questions?  When is my next doctor's visit?  Where can I find a support group for people with diabetes? Where to find more information  American Diabetes Association: www.diabetes.org  American Association of Diabetes Educators: www.diabeteseducator.org/patient-resources Contact a doctor if:  Your blood sugar is at or above 240 mg/dL (47.8 mmol/L) for 2 days in a row.  You have been sick or have had a fever for 2 days or more, and you are not getting better.  You have any of these problems for more than 6 hours: ? You cannot eat or drink. ? You feel sick to your stomach (nauseous). ? You throw up (vomit). ? You have watery poop (diarrhea). Get help right away if:  Your blood sugar is lower than 54 mg/dL (3 mmol/L).  You get confused.  You have trouble: ? Thinking clearly. ? Breathing. Summary  Diabetes (diabetes mellitus) is a long-term (chronic) disease. It occurs when the body does not properly use sugar (glucose) that is released from food after digestion.  Take insulin and diabetes medicines as told.  Check your blood sugar every day, as often as told.  Keep all follow-up visits as told by your doctor. This is important. This information is not intended to replace advice given to you by  your health care provider. Make sure you discuss any questions you have with your health care provider. Document Revised: 05/11/2019 Document Reviewed: 11/20/2017 Elsevier Patient Education  2020 [employer].

## 2020-08-16 ENCOUNTER — Other Ambulatory Visit: Payer: Self-pay | Admitting: *Deleted

## 2020-08-16 ENCOUNTER — Other Ambulatory Visit: Payer: Self-pay

## 2020-08-16 NOTE — Patient Outreach (Signed)
Care Coordination  08/16/2020  Pamela Rivers [birth year] [id number]   Successful outreach today by the Managed Medicaid Care Team RN. However, [name] is not feeling well today and requested to reschedule this appointment. A new telephone visit has been scheduled for 08/20/20 @ 10:30am.   [name] RN, BSN [place]  [facility] RN Care Coordinator

## 2020-08-20 ENCOUNTER — Other Ambulatory Visit: Payer: Self-pay | Admitting: *Deleted

## 2020-08-20 NOTE — Patient Outreach (Signed)
Care Coordination  08/20/2020  Pamela Rivers [birth year] [id number]    Medicaid Managed Care   Unsuccessful Outreach Note  08/20/2020 Name: Pamela Rivers MRN: [id number] DOB: [birth year]  Referred by: System, Provider Not In Reason for referral : High Risk Managed Medicaid (Unsuccessful RNCM follow up outreach)   An unsuccessful telephone outreach was attempted today. The patient was referred to the case management team for assistance with care management and care coordination.   Follow Up Plan: RNCM will follow up with a telephone call over the next 14 days.  [name] RN, BSN [place]  [facility]

## 2020-08-20 NOTE — Patient Instructions (Signed)
Visit Information  Mr. Taran Devargas  - as a part of your Medicaid benefit, you are eligible for care management and care coordination services at no cost or copay. I was unable to reach you by phone today but would be happy to help you with your health related needs. Please feel free to call me @ [phone].   A member of the Managed Medicaid care management team will reach out to you again over the next 7-14 days.   Baelynn Schmuhl RN, BSN [place]  [facility] RN Care Coordinator   

## 2020-08-21 ENCOUNTER — Other Ambulatory Visit: Payer: Self-pay

## 2020-08-21 NOTE — Patient Instructions (Signed)
Visit Information  Pamela Rivers was given information about Medicaid Managed Care team care coordination services as a part of their Healthy [facility] Medicaid benefit. Francene Finders verbally consented to engagement with the [facility] Care team.   For questions related to your Healthy [facility] health plan, please call: [phone] or visit the homepage here: [email or web address]  If you would like to schedule transportation through your Healthy [facility] plan, please call the following number at least 2 days in advance of your appointment: [phone]  Goals Addressed            This Visit's Progress   . Medication Management       Medicaid Managed Care CARE PLAN ENTRY (see longitudinal plan of care for additional care plan information)  Current Barriers:  . Social, financial, community barriers: Patient is in pain, patient uses ER instead of visiting PCP's office . Patient with complex health conditions multiple comorbidities including DM, Hypertension, Cholesterol . Self-manages medications. Does not use a pill box or other adherence strategies . Pharmacy  [id number] [place], [place] - [phone] [place] [address] Phone: [phone] Fax: [phone]     Pharmacist Clinical Goal(s):  Marland Kitchen Over the next 14 days, patient will work with PharmD and provider towards optimized medication management  Interventions: . Comprehensive medication review performed; medication list updated in electronic medical record . Inter-disciplinary care team collaboration (see longitudinal plan of care)  BP  BP Readings from Last 3 Encounters:  07/29/20 105/81  03/20/20 (!) 166/114  12/01/19 132/90   Losartan 50 Plan: At goal,  patient stable/ symptoms controlled  Thyroid Lab Results  Component Value Date   TSH 2.048 02/17/2019    Levothyroxine Plan: At goal,  patient stable/ symptoms  controlled   DM -Sugars in 300's No results found for: HGBA1C  Glimepiride  Pioglitazone Tried/Failed: Sitagliptin (Allergy/diarrhea) Plan: DC Sitagliptin and add to allergy list. Contact PCP and ask for Metformin ER trial. F/U 14 days December 2021: Patient decided to try Farxiga, needs PA per Pharm. Called PCP, they never got PA. Called Pharm again to get PA done. Will call and check in later to make sure completed and patient can get  Mood No flowsheet data found.  Escitalopram Plan: Focused on sugars today, will f/u on this at next visit  Cholesterol No results found for: CHOL No results found for: HDL No results found for: LDLCALC No results found for: TRIG No results found for: CHOLHDL No results found for: LDLDIRECT  Rosuvastatin 10mg  Plan: Focused on sugars today, will try to increase statin at later visit  Pain Tizanidine 4mg  QID Plan: focused on sugars today, will go over pain at f/u    Patient Self Care Activities:  . Patient will take medications as prescribed . Patient will focus on improved adherence by December 22nd   Initial goal documentation  , Pharm.D., Managed Medicaid Pharmacist - [phone]        There are no care plans to display for this patient.   Please see education materials related to DM provided as print materials.   Patient verbalizes understanding of instructions provided today.   The Managed Medicaid care management team will reach out to the patient again over the next 30 days.   Artelia Laroche, RPH  Diabetes Mellitus and Nutrition, Adult When you have diabetes (diabetes mellitus), it is very important to have healthy eating habits because your blood sugar (glucose) levels are greatly affected by  what you eat and drink. Eating healthy foods in the appropriate amounts, at about the same times every day, can help you:  Control your blood glucose.  Lower your risk of heart disease.  Improve your blood  pressure.  Reach or maintain a healthy weight. Every person with diabetes is different, and each person has different needs for a meal plan. Your health care provider may recommend that you work with a diet and nutrition specialist (dietitian) to make a meal plan that is best for you. Your meal plan may vary depending on factors such as:  The calories you need.  The medicines you take.  Your weight.  Your blood glucose, blood pressure, and cholesterol levels.  Your activity level.  Other health conditions you have, such as heart or kidney disease. How do carbohydrates affect me? Carbohydrates, also called carbs, affect your blood glucose level more than any other type of food. Eating carbs naturally raises the amount of glucose in your blood. Carb counting is a method for keeping track of how many carbs you eat. Counting carbs is important to keep your blood glucose at a healthy level, especially if you use insulin or take certain oral diabetes medicines. It is important to know how many carbs you can safely have in each meal. This is different for every person. Your dietitian can help you calculate how many carbs you should have at each meal and for each snack. Foods that contain carbs include:  Bread, cereal, rice, pasta, and crackers.  Potatoes and corn.  Peas, beans, and lentils.  Milk and yogurt.  Fruit and juice.  Desserts, such as cakes, cookies, ice cream, and candy. How does alcohol affect me? Alcohol can cause a sudden decrease in blood glucose (hypoglycemia), especially if you use insulin or take certain oral diabetes medicines. Hypoglycemia can be a life-threatening condition. Symptoms of hypoglycemia (sleepiness, dizziness, and confusion) are similar to symptoms of having too much alcohol. If your health care provider says that alcohol is safe for you, follow these guidelines:  Limit alcohol intake to no more than 1 drink per day for nonpregnant women and 2 drinks per  day for men. One drink equals 12 oz of beer, 5 oz of wine, or 1 oz of hard liquor.  Do not drink on an empty stomach.  Keep yourself hydrated with water, diet soda, or unsweetened iced tea.  Keep in mind that regular soda, juice, and other mixers may contain a lot of sugar and must be counted as carbs. What are tips for following this plan?  Reading food labels  Start by checking the serving size on the "Nutrition Facts" label of packaged foods and drinks. The amount of calories, carbs, fats, and other nutrients listed on the label is based on one serving of the item. Many items contain more than one serving per package.  Check the total grams (g) of carbs in one serving. You can calculate the number of servings of carbs in one serving by dividing the total carbs by 15. For example, if a food has 30 g of total carbs, it would be equal to 2 servings of carbs.  Check the number of grams (g) of saturated and trans fats in one serving. Choose foods that have low or no amount of these fats.  Check the number of milligrams (mg) of salt (sodium) in one serving. Most people should limit total sodium intake to less than 2,300 mg per day.  Always check the nutrition information of  foods labeled as "low-fat" or "nonfat". These foods may be higher in added sugar or refined carbs and should be avoided.  Talk to your dietitian to identify your daily goals for nutrients listed on the label. Shopping  Avoid buying canned, premade, or processed foods. These foods tend to be high in fat, sodium, and added sugar.  Shop around the outside edge of the grocery store. This includes fresh fruits and vegetables, bulk grains, fresh meats, and fresh dairy. Cooking  Use low-heat cooking methods, such as baking, instead of high-heat cooking methods like deep frying.  Cook using healthy oils, such as olive, canola, or sunflower oil.  Avoid cooking with butter, cream, or high-fat meats. Meal planning  Eat  meals and snacks regularly, preferably at the same times every day. Avoid going long periods of time without eating.  Eat foods high in fiber, such as fresh fruits, vegetables, beans, and whole grains. Talk to your dietitian about how many servings of carbs you can eat at each meal.  Eat 4-6 ounces (oz) of lean protein each day, such as lean meat, chicken, fish, eggs, or tofu. One oz of lean protein is equal to: ? 1 oz of meat, chicken, or fish. ? 1 egg. ?  cup of tofu.  Eat some foods each day that contain healthy fats, such as avocado, nuts, seeds, and fish. Lifestyle  Check your blood glucose regularly.  Exercise regularly as told by your health care provider. This may include: ? 150 minutes of moderate-intensity or vigorous-intensity exercise each week. This could be brisk walking, biking, or water aerobics. ? Stretching and doing strength exercises, such as yoga or weightlifting, at least 2 times a week.  Take medicines as told by your health care provider.  Do not use any products that contain nicotine or tobacco, such as cigarettes and e-cigarettes. If you need help quitting, ask your health care provider.  Work with a Veterinary surgeon or diabetes educator to identify strategies to manage stress and any emotional and social challenges. Questions to ask a health care provider  Do I need to meet with a diabetes educator?  Do I need to meet with a dietitian?  What number can I call if I have questions?  When are the best times to check my blood glucose? Where to find more information:  American Diabetes Association: diabetes.org  Academy of Nutrition and Dietetics: www.eatright.[employer] of Diabetes and Digestive and Kidney Diseases (NIH): CarFlippers.tn Summary  A healthy meal plan will help you control your blood glucose and maintain a healthy lifestyle.  Working with a diet and nutrition specialist (dietitian) can help you make a meal plan that is best for  you.  Keep in mind that carbohydrates (carbs) and alcohol have immediate effects on your blood glucose levels. It is important to count carbs and to use alcohol carefully. This information is not intended to replace advice given to you by your health care provider. Make sure you discuss any questions you have with your health care provider. Document Revised: 07/31/2017 Document Reviewed: 09/22/2016 Elsevier Patient Education  2020 [employer].

## 2020-08-30 ENCOUNTER — Other Ambulatory Visit: Payer: Self-pay | Admitting: *Deleted

## 2020-08-30 ENCOUNTER — Other Ambulatory Visit: Payer: Self-pay

## 2020-08-30 NOTE — Patient Instructions (Signed)
Visit Information  [name] was given information about Medicaid Managed Care team care coordination services as a part of their [facility] Medicaid benefit. Pamela Rivers verbally consented to engagement with the [facility] Managed Care team.   For questions related to your [facility] health plan, please call: [phone]  If you would like to schedule transportation through your [facility] plan, please call the following number at least 2 days in advance of your appointment: [phone]   Patient Care Plan: Diabetes Type 2 (Adult)    Problem Identified: Glycemic Management (Diabetes, Type 2)     Long-Range Goal: Glycemic Management Optimized   Start Date: 06/07/2020  Expected End Date: 11/05/2020  This Visit's Progress: On track  Priority: High  Note:   CARE PLAN ENTRY Medicaid Managed Care (see longtitudinal plan of care for additional care plan information)  Objective:  . No results found for: HGBA1C Lab Results  Component Value Date   CREATININE 0.57 11/18/2019   CREATININE 0.73 02/17/2019 .   Pamela Rivers HgbA1C found in Care Everywhere from 05/10/20 was 8.9 . Patient reported cbg findings: fasting 165, postprandial highest 255  Current Barriers:  Pamela Rivers Knowledge Deficits related to basic Diabetes pathophysiology and self care/management. Patient reports that it has been a long time since she had diabetes education. She would like to improve her readings and is interested in information on diabetic diet. Patient would like next HgbA1C to be 8 or less. Patient doing better with meal choices, but continues to eat fast food due to busy schedule.  Pamela Rivers Recent fall, has a shoulder injury and received a steroid injection which elevated her blood sugar . Started wearing an air cast on right leg due to arthritis pain  Case Manager Clinical Goal(s):  Pamela Rivers Over the next 30 days, patient will demonstrate improved adherence to prescribed treatment plan for diabetes self care/management as  evidenced by:  . daily monitoring and recording of CBG and report any findings over 350 to MD  . adherence to ADA/ carb modified diet-Planning to stop buying/eating potatoes . exercise 7 days/week-Met-patient reports exercising everyday-Update-Patient unable to exercise due to recent injury . adherence to prescribed medication regimen  Interventions:  . Provided education to patient about basic DM disease process . Provided Emmi Manager video on diabetic diet and my chart education on how to eat when away from home. Encouraged patient to watch this video and begin a "new way of eating" . Reviewed medications with patient and discussed importance of medication adherence. Patient taking Ibuprofen for pain, educated her on the importance of taking medication with food . Discussed plans with patient for ongoing care management follow up and provided patient with direct contact information for care management team . Reviewed scheduled/upcoming provider appointments including: Endocrinologist 11/19/20 . Advised patient, providing education and rationale, to check cbg fasting and 2 hours after the first bite of food of each meal and record, calling Dr. Valora Piccolo for findings outside established parameters. Encouraged patient to eat a protein rich snack before bedtime.  Pamela Rivers Referral made to pharmacy team for assistance with medication review . Appointment with [facility] for nutrition counseling 07/16/20-Patient reports that she has not started this program, but is planning to start in the new year.  Patient Self Care Activities:  . Self administers oral medications as prescribed . Attends all scheduled provider appointments . Checks blood sugars as prescribed and utilize hyper and hypoglycemia protocol as needed . Adheres to prescribed ADA/carb modified . Exercise daily as she can while  wearing the air cast. RNCM encouraged light 10 minute walks after meals  Please see past updates related to this goal by  clicking on the "Past Updates" button in the selected goal      Please see education materials related to diabetes provided as print materials.     Diabetes Mellitus and Nutrition, Adult When you have diabetes (diabetes mellitus), it is very important to have healthy eating habits because your blood sugar (glucose) levels are greatly affected by what you eat and drink. Eating healthy foods in the appropriate amounts, at about the same times every day, can help you:  Control your blood glucose.  Lower your risk of heart disease.  Improve your blood pressure.  Reach or maintain a healthy weight. Every person with diabetes is different, and each person has different needs for a meal plan. Your health care provider may recommend that you work with a diet and nutrition specialist (dietitian) to make a meal plan that is best for you. Your meal plan may vary depending on factors such as:  The calories you need.  The medicines you take.  Your weight.  Your blood glucose, blood pressure, and cholesterol levels.  Your activity level.  Other health conditions you have, such as heart or kidney disease. How do carbohydrates affect me? Carbohydrates, also called carbs, affect your blood glucose level more than any other type of food. Eating carbs naturally raises the amount of glucose in your blood. Carb counting is a method for keeping track of how many carbs you eat. Counting carbs is important to keep your blood glucose at a healthy level, especially if you use insulin or take certain oral diabetes medicines. It is important to know how many carbs you can safely have in each meal. This is different for every person. Your dietitian can help you calculate how many carbs you should have at each meal and for each snack. Foods that contain carbs include:  Bread, cereal, rice, pasta, and crackers.  Potatoes and corn.  Peas, beans, and lentils.  Milk and yogurt.  Fruit and  juice.  Desserts, such as cakes, cookies, ice cream, and candy. How does alcohol affect me? Alcohol can cause a sudden decrease in blood glucose (hypoglycemia), especially if you use insulin or take certain oral diabetes medicines. Hypoglycemia can be a life-threatening condition. Symptoms of hypoglycemia (sleepiness, dizziness, and confusion) are similar to symptoms of having too much alcohol. If your health care provider says that alcohol is safe for you, follow these guidelines:  Limit alcohol intake to no more than 1 drink per day for nonpregnant women and 2 drinks per day for men. One drink equals 12 oz of beer, 5 oz of wine, or 1 oz of hard liquor.  Do not drink on an empty stomach.  Keep yourself hydrated with water, diet soda, or unsweetened iced tea.  Keep in mind that regular soda, juice, and other mixers may contain a lot of sugar and must be counted as carbs. What are tips for following this plan?  Reading food labels  Start by checking the serving size on the "Nutrition Facts" label of packaged foods and drinks. The amount of calories, carbs, fats, and other nutrients listed on the label is based on one serving of the item. Many items contain more than one serving per package.  Check the total grams (g) of carbs in one serving. You can calculate the number of servings of carbs in one serving by dividing the total carbs  by 15. For example, if a food has 30 g of total carbs, it would be equal to 2 servings of carbs.  Check the number of grams (g) of saturated and trans fats in one serving. Choose foods that have low or no amount of these fats.  Check the number of milligrams (mg) of salt (sodium) in one serving. Most people should limit total sodium intake to less than 2,300 mg per day.  Always check the nutrition information of foods labeled as "low-fat" or "nonfat". These foods may be higher in added sugar or refined carbs and should be avoided.  Talk to your dietitian to  identify your daily goals for nutrients listed on the label. Shopping  Avoid buying canned, premade, or processed foods. These foods tend to be high in fat, sodium, and added sugar.  Shop around the outside edge of the grocery store. This includes fresh fruits and vegetables, bulk grains, fresh meats, and fresh dairy. Cooking  Use low-heat cooking methods, such as baking, instead of high-heat cooking methods like deep frying.  Cook using healthy oils, such as olive, canola, or sunflower oil.  Avoid cooking with butter, cream, or high-fat meats. Meal planning  Eat meals and snacks regularly, preferably at the same times every day. Avoid going long periods of time without eating.  Eat foods high in fiber, such as fresh fruits, vegetables, beans, and whole grains. Talk to your dietitian about how many servings of carbs you can eat at each meal.  Eat 4-6 ounces (oz) of lean protein each day, such as lean meat, chicken, fish, eggs, or tofu. One oz of lean protein is equal to: ? 1 oz of meat, chicken, or fish. ? 1 egg. ?  cup of tofu.  Eat some foods each day that contain healthy fats, such as avocado, nuts, seeds, and fish. Lifestyle  Check your blood glucose regularly.  Exercise regularly as told by your health care provider. This may include: ? 150 minutes of moderate-intensity or vigorous-intensity exercise each week. This could be brisk walking, biking, or water aerobics. ? Stretching and doing strength exercises, such as yoga or weightlifting, at least 2 times a week.  Take medicines as told by your health care provider.  Do not use any products that contain nicotine or tobacco, such as cigarettes and e-cigarettes. If you need help quitting, ask your health care provider.  Work with a Social worker or diabetes educator to identify strategies to manage stress and any emotional and social challenges. Questions to ask a health care provider  Do I need to meet with a diabetes  educator?  Do I need to meet with a dietitian?  What number can I call if I have questions?  When are the best times to check my blood glucose? Where to find more information:  American Diabetes Association: diabetes.org  Academy of Nutrition and Dietetics: www.eatright.[employer] of Diabetes and Digestive and Kidney Diseases (NIH): [employer] Summary  A healthy meal plan will help you control your blood glucose and maintain a healthy lifestyle.  Working with a diet and nutrition specialist (dietitian) can help you make a meal plan that is best for you.  Keep in mind that carbohydrates (carbs) and alcohol have immediate effects on your blood glucose levels. It is important to count carbs and to use alcohol carefully. This information is not intended to replace advice given to you by your health care provider. Make sure you discuss any questions you have with your health care  provider. Document Revised: 07/31/2017 Document Reviewed: 09/22/2016 Elsevier Patient Education  2020 [employer].   Patient verbalizes understanding of instructions provided today.   Telephone follow up appointment with Managed Medicaid care management team member scheduled for:10/29/20 @ 3:30pm  Melissa Montane, RN

## 2020-08-30 NOTE — Patient Outreach (Signed)
Medicaid Managed Care   Nurse Care Manager Note  08/30/2020 Name:  Pamela Rivers MRN:  [id number] DOB:  04/05/1981  Pamela Rivers is an 39 y.o. year old adult who is a primary patient of [facility], Provider Not In.  The [facility] Managed Care Coordination team was consulted for assistance with:    T2DM  Pamela Rivers was given information about Medicaid Managed Care Coordination team services today. Pamela Rivers agreed to services and verbal consent obtained.  Engaged with patient by telephone for follow up visit in response to provider referral for case management and/or care coordination services.   Assessments/Interventions:  Review of past medical history, allergies, medications, health status, including review of consultants reports, laboratory and other test data, was performed as part of comprehensive evaluation and provision of chronic care management services.  SDOH (Social Determinants of Health) assessments and interventions performed:   Care Plan  Allergies  Allergen Reactions  . Januvia [Sitagliptin] Diarrhea  . Other     Tomato   . Vitamin D Analogs     Medications Reviewed Today    Reviewed by Marilu Favre, RN (Registered Nurse) on 07/29/20 at 1233  Med List Status: <None>  Medication Order Taking? Sig Documenting Provider Last Dose Status Informant  baclofen (LIORESAL) 10 MG tablet [id number]  Take 10 mg by mouth 3 (three) times daily.  Patient not taking: Reported on 06/07/2020   [provider]  Consider Medication Status and Discontinue   cetirizine (ZYRTEC) 10 MG tablet [id number]  Take 1 tablet (10 mg total) by mouth daily.  Patient not taking: Reported on 06/07/2020   [name], MD  Consider Medication Status and Discontinue   escitalopram (LEXAPRO) 20 MG tablet [id number]  Take 20 mg by mouth daily. [provider]  Consider Medication Status and Discontinue   fluticasone (FLONASE) 50 MCG/ACT nasal spray [id number]  Place  1 spray into both nostrils daily. [name], MD  Active   glimepiride (AMARYL) 4 MG tablet [id number] Yes Take 4 mg by mouth daily with breakfast. [provider]  Active   levothyroxine (SYNTHROID) 150 MCG tablet [id number] Yes Take 150 mcg by mouth daily before breakfast. [provider]  Active   losartan (COZAAR) 50 MG tablet [id number]  Take 50 mg by mouth daily. [provider]  Consider Medication Status and Discontinue   pioglitazone (ACTOS) 30 MG tablet [id number] Yes Take 30 mg by mouth daily. [provider]  Active   rosuvastatin (CRESTOR) 10 MG tablet [id number] Yes Take 20 mg by mouth daily.  [provider]  Active   sitaGLIPtin (JANUVIA) 50 MG tablet [id number]  Take 50 mg by mouth daily.  Patient not taking: Reported on 07/09/2020   [provider]  Consider Medication Status and Discontinue            Med Note (Rosita Guzzetta A   Mon Jul 09, 2020  9:22 AM) Patient stopped taking on her own          Patient Active Problem List   Diagnosis Date Noted  . Hypothyroidism 07/09/2020  . Type 2 diabetes mellitus ([place]) 07/09/2020  . 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 Flexural atopic dermatitis 12/03/2019  . Chronic rhinitis 12/03/2019  . Current smoker 12/03/2019    Conditions to be addressed/monitored per PCP order:  DMII  Patient Care Plan: Diabetes Type 2 (Adult)    Problem Identified: Glycemic Management (Diabetes, Type 2)     Long-Range Goal:  Glycemic Management Optimized   Start Date: 06/07/2020  Expected End Date: 11/05/2020  This Visit's Progress: On track  Priority: High  Note:   CARE PLAN ENTRY Medicaid Managed Care (see longtitudinal plan of care for additional care plan information)  Objective:  . No results found for: HGBA1C Lab Results  Component Value Date   CREATININE 0.57 11/18/2019   CREATININE 0.73 02/17/2019 .   Marland Kitchen HgbA1C found in Care Everywhere from 05/10/20  was 8.9 . Patient reported cbg findings: fasting 165, postprandial highest 255  Current Barriers:  Marland Kitchen Knowledge Deficits related to basic Diabetes pathophysiology and self care/management. Patient reports that it has been a long time since she had diabetes education. She would like to improve her readings and is interested in information on diabetic diet. Patient would like next HgbA1C to be 8 or less. Patient doing better with meal choices, but continues to eat fast food due to busy schedule.  Marland Kitchen Recent fall, has a shoulder injury and received a steroid injection which elevated her blood sugar . Started wearing an air cast on right leg due to arthritis pain  Case Manager Clinical Goal(s):  Marland Kitchen Over the next 30 days, patient will demonstrate improved adherence to prescribed treatment plan for diabetes self care/management as evidenced by:  . daily monitoring and recording of CBG and report any findings over 350 to MD  . adherence to ADA/ carb modified diet-Planning to stop buying/eating potatoes . exercise 7 days/week-Met-patient reports exercising everyday-Update-Patient unable to exercise due to recent injury . adherence to prescribed medication regimen  Interventions:  . Provided education to patient about basic DM disease process . Provided Emmi Manager video on diabetic diet and my chart education on how to eat when away from home. Encouraged patient to watch this video and begin a "new way of eating" . Reviewed medications with patient and discussed importance of medication adherence. Patient taking Ibuprofen for pain, educated her on the importance of taking medication with food . Discussed plans with patient for ongoing care management follow up and provided patient with direct contact information for care management team . Reviewed scheduled/upcoming provider appointments including: Endocrinologist 11/19/20 . Advised patient, providing education and rationale, to check cbg fasting and 2  hours after the first bite of food of each meal and record, calling Dr. Valora Piccolo for findings outside established parameters. Encouraged patient to eat a protein rich snack before bedtime.  Marland Kitchen Referral made to pharmacy team for assistance with medication review . Appointment with [facility] for nutrition counseling 07/16/20-Patient reports that she has not started this program, but is planning to start in the new year.  Patient Self Care Activities:  . Self administers oral medications as prescribed . Attends all scheduled provider appointments . Checks blood sugars as prescribed and utilize hyper and hypoglycemia protocol as needed . Adheres to prescribed ADA/carb modified . Exercise daily as she can while wearing the air cast. RNCM encouraged light 10 minute walks after meals  Please see past updates related to this goal by clicking on the "Past Updates" button in the selected goal      Follow Up:  Patient agrees to Care Plan and Follow-up.  Plan: The Managed Medicaid care management team will reach out to the patient again over the next 60 days.  Date/time of next scheduled RN care management/care coordination outreach: 10/29/20 @ 3:30pm  Lurena Joiner RN, [place] RN Care Coordinator

## 2020-09-18 ENCOUNTER — Other Ambulatory Visit: Payer: Self-pay

## 2020-09-21 ENCOUNTER — Other Ambulatory Visit: Payer: Self-pay

## 2020-10-29 ENCOUNTER — Other Ambulatory Visit: Payer: Self-pay

## 2020-10-29 ENCOUNTER — Other Ambulatory Visit: Payer: Self-pay | Admitting: *Deleted

## 2020-10-29 NOTE — Patient Outreach (Signed)
Medicaid Managed Care   Nurse Care Manager Note  10/29/2020 Name:  Pamela Rivers MRN:  [id number] DOB:  26-Dec-1980  Pamela Rivers is an 40 y.o. year old adult who is a primary patient of Pamela Rivers, Provider Not In.  The [facility] Managed Care Coordination team was consulted for assistance with:    DMII  Mr. Grilliot was given information about Medicaid Managed Care Coordination team services today. Pamela Rivers agreed to services and verbal consent obtained.  Engaged with patient by telephone for follow up visit in response to provider referral for case management and/or care coordination services.   Assessments/Interventions:  Review of past medical history, allergies, medications, health status, including review of consultants reports, laboratory and other test data, was performed as part of comprehensive evaluation and provision of chronic care management services.  SDOH (Social Determinants of Health) assessments and interventions performed:   Care Plan  Allergies  Allergen Reactions  . Januvia [Sitagliptin] Diarrhea  . Other     Tomato   . Vitamin D Analogs     Medications Reviewed Today    Reviewed by Melissa Montane, RN (Registered Nurse) on 10/29/20 at 1613  Med List Status: <None>  Medication Order Taking? Sig Documenting Provider Last Dose Status Informant  baclofen (LIORESAL) 10 MG tablet [id number] No Take 10 mg by mouth 3 (three) times daily.  Patient not taking: No sig reported   [provider] Not Taking Active   cetirizine (ZYRTEC) 10 MG tablet [id number] No Take 1 tablet (10 mg total) by mouth daily.  Patient not taking: No sig reported   Pamela Shaggy, MD Not Taking Active   escitalopram (LEXAPRO) 20 MG tablet [id number] Yes Take 20 mg by mouth daily. [provider] Taking Active   fluticasone (FLONASE) 50 MCG/ACT nasal spray [id number] Yes Place 1 spray into both nostrils daily. Pamela Shaggy, MD Taking Active    glimepiride [facility]) 4 MG tablet [id number] Yes Take 4 mg by mouth daily with breakfast. [provider] Taking Active   ibuprofen (ADVIL) 600 MG tablet [id number] Yes Take 600 mg by mouth every 6 (six) hours as needed. [provider] Taking Active   levothyroxine (SYNTHROID) 150 MCG tablet [id number] Yes Take 150 mcg by mouth daily before breakfast. [provider] Taking Active            Med Note (Pamela Rivers A   Mon Oct 29, 2020  4:12 PM) Taking 157mg  losartan (COZAAR) 50 MG tablet [id number] Take 50 mg by mouth daily. [provider] Taking Active   pioglitazone (ACTOS) 30 MG tablet [id number] Take 30 mg by mouth daily. [provider] Taking Active   rosuvastatin (CRESTOR) 10 MG tablet [id number] Take 20 mg by mouth daily.  [provider] Taking Active   tiZANidine (ZANAFLEX) 4 MG tablet [id number] Take 1 tablet (4 mg total) by mouth every 6 (six) hours as needed for muscle spasms.  Patient not taking: Reported on 10/29/2020   HScot Jun FNP Not Taking Active           Patient Active Problem List   Diagnosis Date Noted  . Hypothyroidism 07/09/2020  . [place] 2 diabetes mellitus [place] 07/09/2020  . 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 Flexural atopic dermatitis 12/03/2019  . Chronic rhinitis 12/03/2019  . Current smoker 12/03/2019    Conditions to be addressed/monitored per PCP order:  DMII  Care Plan : Diabetes Type 2 (Adult)  Updates made by Melissa Montane, RN since 10/29/2020 12:00 AM    Problem: Glycemic Management (Diabetes, Type 2)     Long-Range Goal: Glycemic Management Optimized   Start Date: 06/07/2020  Expected End Date: 11/05/2020  Recent Progress: On track  Priority: High  Note:   CARE PLAN ENTRY Medicaid Managed Care (see longtitudinal plan of care for additional care plan information)  Objective:  . No results found for: HGBA1C Lab Results  Component Value  Date   CREATININE 0.57 11/18/2019   CREATININE 0.73 02/17/2019 .   Pamela Rivers HgbA1C found in Care Everywhere from 10/2020 was 9.8 . Patient reported cbg findings: fasting 140-150   Current Barriers:  Pamela Rivers Knowledge Deficits related to basic Diabetes pathophysiology and self care/management. Patient reports that it has been a long time since she had diabetes education. She would like to improve her readings and is interested in information on diabetic diet. Patient would like next HgbA1C to be 8 or less. Patient doing Rivers with meal choices, but continues to eat fast food due to busy schedule.-Update-Patient reports really trying to make Rivers meal choices. Watched the Emmi video, has cut out potatoes and soda. Planning to start hiking when the weather warms up. . Recent fall, has a shoulder injury and received a steroid injection which elevated her blood sugar-Continues to see Ortho for elbow pain, will be scheduling an MRI . Started wearing an air cast on right leg due to arthritis pain. Leg pain has improved  Case Manager Clinical Goal(s):  Pamela Rivers Over the next 30 days, patient will demonstrate improved adherence to prescribed treatment plan for diabetes self care/management as evidenced by:  . daily monitoring and recording of CBG and report any findings over 350 to MD  . adherence to ADA/ carb modified diet-Planning to stop buying/eating potatoes . exercise 7 days/week-Met-patient reports exercising everyday-Update-Patient unable to exercise due to recent injury . adherence to prescribed medication regimen  Interventions:  . Provided education to patient about basic DM disease process . Provided Emmi Manager video on diabetic diet and my chart education on how to eat when away from home. Encouraged patient to watch this video and begin a "new way of eating" . Reviewed medications with patient and discussed importance of medication adherence. Patient taking Ibuprofen for pain, educated her on the  importance of taking medication with food . Discussed plans with patient for ongoing care management follow up and provided patient with direct contact information for care management team . Reviewed scheduled/upcoming provider appointments including: Endocrinologist 12/26/20, PCP 11/12/20 . Advised patient, providing education and rationale, to check cbg fasting and 2 hours after the first bite of food of each meal and record, calling Dr. Valora Piccolo for findings outside established parameters. Encouraged patient to eat a protein rich snack before bedtime.  Pamela Rivers Referral made to pharmacy team for assistance with medication review . Appointment with [facility] for nutrition counseling 07/16/20-Patient reports that he has not started this program, but is planning to start in the new year.-Patient feels that he can make changes on his own and is not planning to continue this program. . Discussed diabetic diet and food choices  Patient Self Care Activities:  . Self administers oral medications as prescribed . Attends all scheduled provider appointments . Checks blood sugars as prescribed and utilize hyper and hypoglycemia protocol as needed . Adheres to prescribed ADA/carb modified . Exercise for 30 min a day x 5 days a week  Please see past updates related to  this goal by clicking on the "Past Updates" button in the selected goal      Follow Up:  Patient agrees to Care Plan and Follow-up.  Plan: The Managed Medicaid care management team will reach out to the patient again over the next 60 days.  Date/time of next scheduled RN care management/care coordination outreach:12/31/20 @ 1pm  Lurena Joiner RN, [place] RN Care Coordinator

## 2020-10-29 NOTE — Patient Instructions (Signed)
Visit Information  [name] was given information about Medicaid Managed Care team care coordination services as a part of their [facility] Medicaid benefit. Pamela Rivers verbally consented to engagement with the [facility] Managed Care team.   For questions related to your [facility] health plan, please call: [phone]  If you would like to schedule transportation through your [facility] plan, please call the following number at least 2 days in advance of your appointment: [phone]   Pamela Rivers - following are the goals we discussed in your visit today:  Goals Addressed            This Visit's Progress   . Monitor and Manage My Blood Sugar-Diabetes Type 2       Timeframe:  Long-Range Goal Priority:  High Start Date:    06/07/20                         Expected End Date:   12/31/20                 .  Self administers oral medications as prescribed . Attends all scheduled provider appointments . Checks blood sugars as prescribed and utilize hyper and hypoglycemia protocol as needed . Adheres to prescribed ADA/carb modified . Exercise for 30 min a day x 5 days a week     Why is this important?    Checking your blood sugar at home helps to keep it from getting very high or very low.   Writing the results in a diary or log helps the doctor know how to care for you.   Your blood sugar log should have the time, date and the results.   Also, write down the amount of insulin or other medicine that you take.   Other information, like what you ate, exercise done and how you were feeling, will also be helpful.            Please see education materials related to diabetes provided as print materials.     Diabetes Mellitus and Nutrition, Adult When you have diabetes, or diabetes mellitus, it is very important to have healthy eating habits because your blood sugar (glucose) levels are greatly affected by what you eat and drink. Eating healthy foods in the  right amounts, at about the same times every day, can help you:  Control your blood glucose.  Lower your risk of heart disease.  Improve your blood pressure.  Reach or maintain a healthy weight. What can affect my meal plan? Every person with diabetes is different, and each person has different needs for a meal plan. Your health care provider may recommend that you work with a dietitian to make a meal plan that is best for you. Your meal plan may vary depending on factors such as:  The calories you need.  The medicines you take.  Your weight.  Your blood glucose, blood pressure, and cholesterol levels.  Your activity level.  Other health conditions you have, such as heart or kidney disease. How do carbohydrates affect me? Carbohydrates, also called carbs, affect your blood glucose level more than any other type of food. Eating carbs naturally raises the amount of glucose in your blood. Carb counting is a method for keeping track of how many carbs you eat. Counting carbs is important to keep your blood glucose at a healthy level, especially if you use insulin or take certain oral diabetes medicines. It is important to know how many  carbs you can safely have in each meal. This is different for every person. Your dietitian can help you calculate how many carbs you should have at each meal and for each snack. How does alcohol affect me? Alcohol can cause a sudden decrease in blood glucose (hypoglycemia), especially if you use insulin or take certain oral diabetes medicines. Hypoglycemia can be a life-threatening condition. Symptoms of hypoglycemia, such as sleepiness, dizziness, and confusion, are similar to symptoms of having too much alcohol.  Do not drink alcohol if: ? Your health care provider tells you not to drink. ? You are pregnant, may be pregnant, or are planning to become pregnant.  If you drink alcohol: ? Do not drink on an empty stomach. ? Limit how much you use to:  0-1  drink a day for women.  0-2 drinks a day for men. ? Be aware of how much alcohol is in your drink. In the [place] one drink equals one 12 oz bottle of beer (355 mL), one 5 oz glass of wine (148 mL), or one 1 oz glass of hard liquor (44 mL). ? Keep yourself hydrated with water, diet soda, or unsweetened iced tea.  Keep in mind that regular soda, juice, and other mixers may contain a lot of sugar and must be counted as carbs. What are tips for following this plan? Reading food labels  Start by checking the serving size on the "Nutrition Facts" label of packaged foods and drinks. The amount of calories, carbs, fats, and other nutrients listed on the label is based on one serving of the item. Many items contain more than one serving per package.  Check the total grams (g) of carbs in one serving. You can calculate the number of servings of carbs in one serving by dividing the total carbs by 15. For example, if a food has 30 g of total carbs per serving, it would be equal to 2 servings of carbs.  Check the number of grams (g) of saturated fats and trans fats in one serving. Choose foods that have a low amount or none of these fats.  Check the number of milligrams (mg) of salt (sodium) in one serving. Most people should limit total sodium intake to less than 2,300 mg per day.  Always check the nutrition information of foods labeled as "low-fat" or "nonfat." These foods may be higher in added sugar or refined carbs and should be avoided.  Talk to your dietitian to identify your daily goals for nutrients listed on the label. Shopping  Avoid buying canned, pre-made, or processed foods. These foods tend to be high in fat, sodium, and added sugar.  Shop around the outside edge of the grocery store. This is where you will most often find fresh fruits and vegetables, bulk grains, fresh meats, and fresh dairy. Cooking  Use low-heat cooking methods, such as baking, instead of high-heat cooking methods  like deep frying.  Cook using healthy oils, such as olive, canola, or sunflower oil.  Avoid cooking with butter, cream, or high-fat meats. Meal planning  Eat meals and snacks regularly, preferably at the same times every day. Avoid going long periods of time without eating.  Eat foods that are high in fiber, such as fresh fruits, vegetables, beans, and whole grains. Talk with your dietitian about how many servings of carbs you can eat at each meal.  Eat 4-6 oz (112-168 g) of lean protein each day, such as lean meat, chicken, fish, eggs, or tofu. One ounce (  oz) of lean protein is equal to: ? 1 oz (28 g) of meat, chicken, or fish. ? 1 egg. ?  cup (62 g) of tofu.  Eat some foods each day that contain healthy fats, such as avocado, nuts, seeds, and fish.   What foods should I eat? Fruits Berries. Apples. Oranges. Peaches. Apricots. Plums. Grapes. Mango. Papaya. Pomegranate. Kiwi. Cherries. Vegetables Lettuce. Spinach. Leafy greens, including kale, chard, collard greens, and mustard greens. Beets. Cauliflower. Cabbage. Broccoli. Carrots. Green beans. Tomatoes. Peppers. Onions. Cucumbers. Brussels sprouts. Grains Whole grains, such as whole-wheat or whole-grain bread, crackers, tortillas, cereal, and pasta. Unsweetened oatmeal. Quinoa. Brown or wild rice. Meats and other proteins Seafood. Poultry without skin. Lean cuts of poultry and beef. Tofu. Nuts. Seeds. Dairy Low-fat or fat-free dairy products such as milk, yogurt, and cheese. The items listed above may not be a complete list of foods and beverages you can eat. Contact a dietitian for more information. What foods should I avoid? Fruits Fruits canned with syrup. Vegetables Canned vegetables. Frozen vegetables with butter or cream sauce. Grains Refined white flour and flour products such as bread, pasta, snack foods, and cereals. Avoid all processed foods. Meats and other proteins Fatty cuts of meat. Poultry with skin. Breaded or  fried meats. Processed meat. Avoid saturated fats. Dairy Full-fat yogurt, cheese, or milk. Beverages Sweetened drinks, such as soda or iced tea. The items listed above may not be a complete list of foods and beverages you should avoid. Contact a dietitian for more information. Questions to ask a health care provider  Do I need to meet with a diabetes educator?  Do I need to meet with a dietitian?  What number can I call if I have questions?  When are the best times to check my blood glucose? Where to find more information:  American Diabetes Association: diabetes.org  Academy of Nutrition and Dietetics: www.eatright.[employer] of Diabetes and Digestive and Kidney Diseases: [employer].dk  Association of Diabetes Care and Education Specialists: www.diabeteseducator.org Summary  It is important to have healthy eating habits because your blood sugar (glucose) levels are greatly affected by what you eat and drink.  A healthy meal plan will help you control your blood glucose and maintain a healthy lifestyle.  Your health care provider may recommend that you work with a dietitian to make a meal plan that is best for you.  Keep in mind that carbohydrates (carbs) and alcohol have immediate effects on your blood glucose levels. It is important to count carbs and to use alcohol carefully. This information is not intended to replace advice given to you by your health care provider. Make sure you discuss any questions you have with your health care provider. Document Revised: 07/26/2019 Document Reviewed: 07/26/2019 Elsevier Patient Education  2021 [employer].   The patient verbalized understanding of instructions provided today and agreed to receive a mailed copy of patient instruction and/or educational materials.  Telephone follow up appointment with Managed Medicaid care management team member scheduled for:12/31/20 @ [place], RN  Following is a copy  of your plan of care:  Patient Care Plan: Diabetes Type 2 (Adult)    Problem Identified: Glycemic Management (Diabetes, Type 2)     Long-Range Goal: Glycemic Management Optimized   Start Date: 06/07/2020  Expected End Date: 11/05/2020  Recent Progress: On track  Priority: High  Note:   CARE PLAN ENTRY Medicaid Managed Care (see longtitudinal plan of care for additional care plan information)  Objective:  . No results found for: HGBA1C Lab Results  Component Value Date   CREATININE 0.57 11/18/2019   CREATININE 0.73 02/17/2019 .   Marland Kitchen HgbA1C found in Care Everywhere from 10/2020 was 9.8 . Patient reported cbg findings: fasting 140-150   Current Barriers:  Marland Kitchen Knowledge Deficits related to basic Diabetes pathophysiology and self care/management. Patient reports that it has been a long time since she had diabetes education. She would like to improve her readings and is interested in information on diabetic diet. Patient would like next HgbA1C to be 8 or less. Patient doing better with meal choices, but continues to eat fast food due to busy schedule.-Update-Patient reports really trying to make better meal choices. Watched the Emmi video, has cut out potatoes and soda. Planning to start hiking when the weather warms up. . Recent fall, has a shoulder injury and received a steroid injection which elevated her blood sugar-Continues to see Ortho for elbow pain, will be scheduling an MRI . Started wearing an air cast on right leg due to arthritis pain. Leg pain has improved  Case Manager Clinical Goal(s):  Marland Kitchen Over the next 30 days, patient will demonstrate improved adherence to prescribed treatment plan for diabetes self care/management as evidenced by:  . daily monitoring and recording of CBG and report any findings over 350 to MD  . adherence to ADA/ carb modified diet-Planning to stop buying/eating potatoes . exercise 7 days/week-Met-patient reports exercising everyday-Update-Patient unable to  exercise due to recent injury . adherence to prescribed medication regimen  Interventions:  . Provided education to patient about basic DM disease process . Provided Emmi Manager video on diabetic diet and my chart education on how to eat when away from home. Encouraged patient to watch this video and begin a "new way of eating" . Reviewed medications with patient and discussed importance of medication adherence. Patient taking Ibuprofen for pain, educated her on the importance of taking medication with food . Discussed plans with patient for ongoing care management follow up and provided patient with direct contact information for care management team . Reviewed scheduled/upcoming provider appointments including: Endocrinologist 12/26/20, PCP 11/12/20 . Advised patient, providing education and rationale, to check cbg fasting and 2 hours after the first bite of food of each meal and record, calling Dr. Valora Piccolo for findings outside established parameters. Encouraged patient to eat a protein rich snack before bedtime.  Marland Kitchen Referral made to pharmacy team for assistance with medication review . Appointment with [facility] for nutrition counseling 07/16/20-Patient reports that he has not started this program, but is planning to start in the new year.-Patient feels that he can make changes on his own and is not planning to continue this program. . Discussed diabetic diet and food choices  Patient Self Care Activities:  . Self administers oral medications as prescribed . Attends all scheduled provider appointments . Checks blood sugars as prescribed and utilize hyper and hypoglycemia protocol as needed . Adheres to prescribed ADA/carb modified . Exercise for 30 min a day x 5 days a week  Please see past updates related to this goal by clicking on the "Past Updates" button in the selected goal

## 2020-11-01 ENCOUNTER — Ambulatory Visit: Payer: Medicaid Other | Admitting: Internal Medicine

## 2020-11-01 ENCOUNTER — Telehealth: Payer: Self-pay | Admitting: Internal Medicine

## 2020-11-01 NOTE — Progress Notes (Deleted)
Name: Pamela Rivers  MRN/ DOB: [id number], 1980-12-01    Age/ Sex: 40 y.o., adult    PCP: System, Provider Not In   Reason for Endocrinology Evaluation: Hypothyroidism     Date of Initial Endocrinology Evaluation: 11/01/2020     HPI: [name] is a 40 y.o. adult with a past medical history of ***. The patient presented for initial endocrinology clinic visit on 11/01/2020 for consultative assistance with his hypothyridism.   ***  HISTORY:  Past Medical History:  Past Medical History:  Diagnosis Date  . Anxiety   . Depression   . Diabetes mellitus without complication (HCC)   . Fibromyalgia   . Hashimoto's disease   . Hypertension   . Hypothyroidism 07/09/2020  . Thyroid disease   . Type 2 diabetes mellitus (HCC) 07/09/2020    Past Surgical History:  Past Surgical History:  Procedure Laterality Date  . ABDOMINAL HYSTERECTOMY    . BREAST SURGERY    . TONSILLECTOMY        Social History:  reports that he has been smoking cigarettes. He has been smoking about 0.25 packs per day. He has never used smokeless tobacco. He reports current alcohol use. He reports current drug use. Drug: Marijuana.  Family History: family history includes Diabetes in his father and mother; Hypertension in his father and mother.   HOME MEDICATIONS: Allergies as of 11/01/2020      Reactions   Januvia [sitagliptin] Diarrhea   Other    Tomato   Vitamin D Analogs       Medication List       Accurate as of November 01, 2020  7:10 AM. If you have any questions, ask your nurse or doctor.        baclofen 10 MG tablet Commonly known as: LIORESAL Take 10 mg by mouth 3 (three) times daily.   cetirizine 10 MG tablet Commonly known as: ZYRTEC Take 1 tablet (10 mg total) by mouth daily.   escitalopram 20 MG tablet Commonly known as: LEXAPRO Take 20 mg by mouth daily.   fluticasone 50 MCG/ACT nasal spray Commonly known as: FLONASE Place 1 spray into both nostrils daily.    glimepiride 4 MG tablet Commonly known as: AMARYL Take 4 mg by mouth daily with breakfast.   ibuprofen 600 MG tablet Commonly known as: ADVIL Take 600 mg by mouth every 6 (six) hours as needed.   levothyroxine 150 MCG tablet Commonly known as: SYNTHROID Take 150 mcg by mouth daily before breakfast.   losartan 50 MG tablet Commonly known as: COZAAR Take 50 mg by mouth daily.   pioglitazone 30 MG tablet Commonly known as: ACTOS Take 30 mg by mouth daily.   rosuvastatin 10 MG tablet Commonly known as: CRESTOR Take 20 mg by mouth daily.   tiZANidine 4 MG tablet Commonly known as: Zanaflex Take 1 tablet (4 mg total) by mouth every 6 (six) hours as needed for muscle spasms.         REVIEW OF SYSTEMS: A comprehensive ROS was conducted with the patient and is negative except as per HPI and below:  ROS     OBJECTIVE:  VS: There were no vitals taken for this visit.   Wt Readings from Last 3 Encounters:  03/19/20 281 lb (127.5 kg)  12/01/19 247 lb (112 kg)  11/18/19 239 lb (108.4 kg)     EXAM: General: Pt appears well and is in NAD  Hydration: Well-hydrated with moist mucous membranes and good skin  turgor  Eyes: External eye exam normal without stare, lid lag or exophthalmos.  EOM intact.  PERRL.  Ears, Nose, Throat: Hearing: Grossly intact bilaterally Dental: Good dentition  Throat: Clear without mass, erythema or exudate  Neck: General: Supple without adenopathy. Thyroid: Thyroid size normal.  No goiter or nodules appreciated. No thyroid bruit.  Lungs: Clear with good BS bilat with no rales, rhonchi, or wheezes  Heart: Auscultation: RRR.  Abdomen: Normoactive bowel sounds, soft, nontender, without masses or organomegaly palpable  Extremities: Gait and station: Normal gait  Digits and nails: No clubbing, cyanosis, petechiae, or nodes Head and neck: Normal alignment and mobility BL UE: Normal ROM and strength. BL LE: No pretibial edema normal ROM and strength.   Skin: Hair: Texture and amount normal with gender appropriate distribution Skin Inspection: No rashes, acanthosis nigricans/skin tags. No lipohypertrophy Skin Palpation: Skin temperature, texture, and thickness normal to palpation  Neuro: Cranial nerves: II - XII grossly intact  Cerebellar: Normal coordination and movement; no tremor Motor: Normal strength throughout DTRs: 2+ and symmetric in UE without delay in relaxation phase  Mental Status: Judgment, insight: Intact Orientation: Oriented to time, place, and person Memory: Intact for recent and remote events Mood and affect: No depression, anxiety, or agitation     DATA REVIEWED: ***    ASSESSMENT/PLAN/RECOMMENDATIONS:   1. ***    Medications :  Signed electronically by: Lyndle Herrlich, MD  [facility] Endocr[facility]ology  [facility] [address] [place], [place] [address] Phone: [phone] FAX: [phone]   CC: System, Provider Not In No address on file Phone: None Fax: None   Return to Endocrinology clinic as below: Future Appointments  Date Time Provider Department Center  11/01/2020 10:30 AM , Konrad Dolores, MD [facility] None  12/31/2020  1:00 PM [facility] CARE MANAGER [facility] None

## 2020-11-01 NOTE — Telephone Encounter (Signed)
Pamela Rivers,    Please close this referral. The pt "no showed" and has established with another endo on 10/11/20   Thanks  Abby Raelyn Mora, MD  [facility]  [facility] [address] [place], [place] [address] Phone: [phone] FAX: [phone]

## 2020-12-31 ENCOUNTER — Other Ambulatory Visit: Payer: Self-pay | Admitting: *Deleted

## 2020-12-31 NOTE — Patient Outreach (Signed)
Care Coordination  12/31/2020  [name] 07/03/1981 [id number]    Medicaid Managed Care   Unsuccessful Outreach Note  12/31/2020 Name: Pamela Rivers MRN: [id number] DOB: Sep 17, 1980  Referred by: System, Provider Not In Reason for referral : High Risk Managed Medicaid (Unsuccessful RNCM follow up outreach)   An unsuccessful telephone outreach was attempted today. The patient was referred to the case management team for assistance with care management and care coordination.   Follow Up Plan: A HIPAA compliant phone message was left for the patient providing contact information and requesting a return call.  The care management team will reach out to the patient again over the next 7-14 days.   [name] RN, BSN [place]  [employer]

## 2020-12-31 NOTE — Patient Instructions (Signed)
Visit Information  Mr. Ricca Melgarejo  - as a part of your Medicaid benefit, you are eligible for care management and care coordination services at no cost or copay. I was unable to reach you by phone today but would be happy to help you with your health related needs. Please feel free to call me @ [phone].   A member of the Managed Medicaid care management team will reach out to you again over the next 7-14 days.   [name] RN, BSN [place]  [employer]

## 2021-02-03 ENCOUNTER — Other Ambulatory Visit: Payer: Self-pay

## 2021-02-03 ENCOUNTER — Emergency Department (HOSPITAL_COMMUNITY)
Admission: EM | Admit: 2021-02-03 | Discharge: 2021-02-03 | Disposition: A | Discharge status: Home / Self Care | Payer: Medicaid Other | Attending: Emergency Medicine | Admitting: Emergency Medicine

## 2021-02-03 DIAGNOSIS — F17210 Nicotine dependence, cigarettes, uncomplicated: Secondary | ICD-10-CM | POA: Diagnosis not present

## 2021-02-03 DIAGNOSIS — Z7984 Long term (current) use of oral hypoglycemic drugs: Secondary | ICD-10-CM | POA: Diagnosis not present

## 2021-02-03 DIAGNOSIS — Z79899 Other long term (current) drug therapy: Secondary | ICD-10-CM | POA: Diagnosis not present

## 2021-02-03 DIAGNOSIS — M79604 Pain in right leg: Secondary | ICD-10-CM | POA: Insufficient documentation

## 2021-02-03 DIAGNOSIS — E039 Hypothyroidism, unspecified: Secondary | ICD-10-CM | POA: Insufficient documentation

## 2021-02-03 DIAGNOSIS — I160 Hypertensive urgency: Secondary | ICD-10-CM

## 2021-02-03 DIAGNOSIS — E119 Type 2 diabetes mellitus without complications: Secondary | ICD-10-CM | POA: Diagnosis not present

## 2021-02-03 DIAGNOSIS — I10 Essential (primary) hypertension: Secondary | ICD-10-CM | POA: Diagnosis present

## 2021-02-03 LAB — COMPREHENSIVE METABOLIC PANEL
ALT: 18 U/L (ref 0–44)
AST: 17 U/L (ref 15–41)
Albumin: 3.6 g/dL (ref 3.5–5.0)
Alkaline Phosphatase: 94 U/L (ref 38–126)
Anion gap: 8 (ref 5–15)
BUN: 12 mg/dL (ref 6–20)
CO2: 28 mmol/L (ref 22–32)
Calcium: 9.2 mg/dL (ref 8.9–10.3)
Chloride: 104 mmol/L (ref 98–111)
Creatinine, Ser: 0.66 mg/dL (ref 0.44–1.00)
GFR, Estimated: >60 mL/min (ref >60)
Glucose, Bld: 183 mg/dL — ABNORMAL HIGH (ref 70–99)
Potassium: 3.8 mmol/L (ref 3.5–5.1)
Sodium: 140 mmol/L (ref 135–145)
Total Bilirubin: 0.6 mg/dL (ref 0.3–1.2)
Total Protein: 7.3 g/dL (ref 6.5–8.1)

## 2021-02-03 LAB — URINALYSIS, ROUTINE W REFLEX MICROSCOPIC
Bilirubin Urine: NEGATIVE
Glucose, UA: NEGATIVE mg/dL
Hgb urine dipstick: NEGATIVE
Ketones, ur: NEGATIVE mg/dL
Leukocytes,Ua: NEGATIVE
Nitrite: NEGATIVE
Protein, ur: NEGATIVE mg/dL
Specific Gravity, Urine: 1.028 (ref 1.005–1.030)
pH: 5 (ref 5.0–8.0)

## 2021-02-03 LAB — CBC WITH DIFFERENTIAL/PLATELET
Abs Immature Granulocytes: 0.02 10*3/uL (ref 0.00–0.07)
Basophils Absolute: 0 10*3/uL (ref 0.0–0.1)
Basophils Relative: 0 %
Eosinophils Absolute: 0.1 10*3/uL (ref 0.0–0.5)
Eosinophils Relative: 1 %
HCT: 37.9 % (ref 36.0–46.0)
Hemoglobin: 11.9 g/dL — ABNORMAL LOW (ref 12.0–15.0)
Immature Granulocytes: 0 %
Lymphocytes Relative: 31 %
Lymphs Abs: 1.8 10*3/uL (ref 0.7–4.0)
MCH: 26.6 pg (ref 26.0–34.0)
MCHC: 31.4 g/dL (ref 30.0–36.0)
MCV: 84.6 fL (ref 80.0–100.0)
Monocytes Absolute: 0.4 10*3/uL (ref 0.1–1.0)
Monocytes Relative: 7 %
Neutro Abs: 3.4 10*3/uL (ref 1.7–7.7)
Neutrophils Relative %: 61 %
Platelets: 210 10*3/uL (ref 150–400)
RBC: 4.48 MIL/uL (ref 3.87–5.11)
RDW: 13 % (ref 11.5–15.5)
WBC: 5.7 10*3/uL (ref 4.0–10.5)
nRBC: 0 % (ref 0.0–0.2)

## 2021-02-03 LAB — T4, FREE: Free T4: 1.19 ng/dL — ABNORMAL HIGH (ref 0.61–1.12)

## 2021-02-03 LAB — TSH: TSH: 4.124 u[IU]/mL (ref 0.350–4.500)

## 2021-02-03 LAB — BRAIN NATRIURETIC PEPTIDE: B Natriuretic Peptide: 42.8 pg/mL (ref 0.0–100.0)

## 2021-02-03 LAB — ETHANOL: Alcohol, Ethyl (B): <10 mg/dL (ref <10)

## 2021-02-03 LAB — LIPASE, BLOOD: Lipase: 32 U/L (ref 11–51)

## 2021-02-03 MED ORDER — PROCHLORPERAZINE EDISYLATE 10 MG/2ML IJ SOLN
10.0000 mg | Freq: Once | INTRAMUSCULAR | Status: AC
  Administered 2021-02-03: 10 mg via INTRAVENOUS
  Filled 2021-02-03: qty 2

## 2021-02-03 NOTE — ED Triage Notes (Signed)
Pt came in with c/o htn and headache. Pt states that he was at home tonight when his head started hurting. He states "I felt my heart beating in my R ear". Pt also c/o R sided neck pain. Pt pressure at home was 167/109. Current pressure 154/110

## 2021-02-03 NOTE — ED Notes (Signed)
Pt walked into hallway and stated her ride was here and that she was ready to leave. RN informed pt of AMA protocol. Pt decided to leave AMA.

## 2021-02-03 NOTE — ED Provider Notes (Signed)
[facility]-EMERGENCY DEPT Provider Note   CSN: [id number] Arrival date & time: 02/03/21  0420     History Chief Complaint  Patient presents with  . Hypertension    Rayana Geurin is a 40 y.o. adult.  HPI Patient with history of diabetes, hypertension, anxiety presents with multiple complaints, primarily around hypertension. Patient has been taking all medication as directed including multiple antihypertensive.  Now, over the past week there have been multiple episodes of mild headache, transient right visual disturbance, and right leg pain. Patient notes ongoing, worsening stress related to her relationship. Otherwise, no recent change in activity, diet, medication.  Currently no substantial pain in the head, no weakness in any extremity, no chest pain, no dyspnea.  After noting increased blood pressure readings over the week, with more substantial elevation this morning upon awakening, patient presents for evaluation.     Past Medical History:  Diagnosis Date  . Anxiety   . Depression   . Diabetes mellitus without complication (HCC)   . Fibromyalgia   . Hashimoto's disease   . Hypertension   . Hypothyroidism 07/09/2020  . Thyroid disease   . Type 2 diabetes mellitus (HCC) 07/09/2020    Patient Active Problem List   Diagnosis Date Noted  . Hypothyroidism 07/09/2020  . Type 2 diabetes mellitus (HCC) 07/09/2020  . 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 Flexural atopic dermatitis 12/03/2019  . Chronic rhinitis 12/03/2019  . Current smoker 12/03/2019    Past Surgical History:  Procedure Laterality Date  . ABDOMINAL HYSTERECTOMY    . BREAST SURGERY    . TONSILLECTOMY       OB History   No obstetric history on file.     Family History  Problem Relation Age of Onset  . Diabetes Mother   . Hypertension Mother   . Diabetes Father   . Hypertension Father     Social History   Tobacco Use  . Smoking status: Current  Every Day Smoker    Packs/day: 0.25    Types: Cigarettes  . Smokeless tobacco: Never Used  Vaping Use  . Vaping Use: Never used  Substance Use Topics  . Alcohol use: Yes    Comment: rarely  . Drug use: Yes    Types: Marijuana    Comment: mostly CBD    Home Medications Prior to Admission medications   Medication Sig Start Date End Date Taking? Authorizing Provider  escitalopram (LEXAPRO) 20 MG tablet Take 20 mg by mouth daily.   Yes [provider]  fluticasone (FLONASE) 50 MCG/ACT nasal spray Place 1 spray into both nostrils daily. 12/01/19  Yes Alfonse Spruce, MD  glimepiride (AMARYL) 4 MG tablet Take 4 mg by mouth daily with breakfast.   Yes [provider]  levothyroxine (SYNTHROID) 150 MCG tablet Take 175 mcg by mouth daily before breakfast.   Yes [provider]  losartan (COZAAR) 50 MG tablet Take 50 mg by mouth daily.   Yes [provider]  metFORMIN (GLUCOPHAGE-XR) 500 MG 24 hr tablet Take 500 mg by mouth daily with breakfast. 01/31/21  Yes [provider]  nystatin (MYCOSTATIN/NYSTOP) powder Apply 1 application topically 2 (two) times daily. 12/15/20 12/15/21 Yes [provider]  pioglitazone (ACTOS) 30 MG tablet Take 30 mg by mouth daily.   Yes [provider]  rosuvastatin (CRESTOR) 10 MG tablet Take 20 mg by mouth daily.    Yes [provider]  baclofen (LIORESAL) 10 MG tablet Take  10 mg by mouth 3 (three) times daily. Patient not taking: No sig reported    [provider]  cetirizine (ZYRTEC) 10 MG tablet Take 1 tablet (10 mg total) by mouth daily. Patient not taking: No sig reported 12/01/19   Alfonse Spruce, MD  tiZANidine (ZANAFLEX) 4 MG tablet Take 1 tablet (4 mg total) by mouth every 6 (six) hours as needed for muscle spasms. Patient not taking: Reported on 02/03/2021 07/29/20   Bing Neighbors, FNP    Allergies    Januvia [sitagliptin], Other, and Vitamin d analogs  Review  of Systems   Review of Systems  Constitutional:       Per HPI, otherwise negative  HENT:       Per HPI, otherwise negative  Respiratory:       Per HPI, otherwise negative  Cardiovascular:       Per HPI, otherwise negative  Gastrointestinal: Negative for vomiting.  Endocrine:       Negative aside from HPI  Genitourinary:       Neg aside from HPI   Musculoskeletal:       Per HPI, otherwise negative  Skin: Negative.   Neurological: Positive for headaches. Negative for syncope.    Physical Exam Updated Vital Signs BP 135/72   Pulse 89   Temp 98.2 F (36.8 C) (Oral)   Resp 18   Ht 5\' 3"  (1.6 m)   Wt 131.5 kg   SpO2 98%   BMI 51.37 kg/m   Physical Exam Vitals and nursing note reviewed.  Constitutional:      General: He is not in acute distress.    Appearance: He is well-developed.  HENT:     Head: Normocephalic and atraumatic.  Eyes:     Conjunctiva/sclera: Conjunctivae normal.  Cardiovascular:     Rate and Rhythm: Normal rate and regular rhythm.  Pulmonary:     Effort: Pulmonary effort is normal. No respiratory distress.     Breath sounds: No stridor.  Abdominal:     General: There is no distension.  Skin:    General: Skin is warm and dry.  Neurological:     General: No focal deficit present.     Mental Status: He is alert and oriented to person, place, and time.     Cranial Nerves: No cranial nerve deficit.  Psychiatric:        Mood and Affect: Mood normal.        Behavior: Behavior normal.     ED Results / Procedures / Treatments   Labs (all labs ordered are listed, but only abnormal results are displayed) Labs Reviewed  COMPREHENSIVE METABOLIC PANEL - Abnormal; Notable for the following components:      Result Value   Glucose, Bld 183 (*)    All other components within normal limits  CBC WITH DIFFERENTIAL/PLATELET - Abnormal; Notable for the following components:   Hemoglobin 11.9 (*)    All other components within normal limits  TSH  ETHANOL   LIPASE, BLOOD  BRAIN NATRIURETIC PEPTIDE  URINALYSIS, ROUTINE W REFLEX MICROSCOPIC  T4, FREE    EKG None  Radiology No results found.  Procedures Procedures   Medications Ordered in ED Medications  prochlorperazine (COMPAZINE) injection 10 mg (10 mg Intravenous Given 02/03/21 0843)    ED Course  I have reviewed the triage vital signs and the nursing notes.  Pertinent labs & imaging results that were available during my care of the patient were reviewed by me and considered  in my medical decision making (see chart for details).    11:04 AM Patient's labs reassuring, no evidence for endorgan damage, thyroid function thus far unremarkable.  Patient remained hemodynamically stable 4 hours of monitoring, but now has departed prior to my repeat evaluation or chance to consider labs.  Vitals were reassuring as well, with no sustained hypertension here.  However, patient left AGAINST MEDICAL ADVICE prior to completion of his care  Final Clinical Impression(s) / ED Diagnoses Final diagnoses:  Hypertensive urgency     Gerhard Munch, MD 02/03/21 1106

## 2021-02-19 ENCOUNTER — Other Ambulatory Visit: Payer: Medicaid Other | Admitting: *Deleted

## 2021-02-19 ENCOUNTER — Other Ambulatory Visit: Payer: Medicaid Other

## 2021-02-19 NOTE — Patient Outreach (Signed)
Care Coordination  02/19/2021  Maritta Kief 1981/01/08 [id number]  RNCM received voicemail from this patient asking for assistance with transportation for a scheduled colonoscopy. RNCM was unable to leave a message at patient's new number provided to [facility]. RNCM will attempt to reach patient again over the next 2-3 days.  [name] RN, BSN [place]  [employer]

## 2021-02-19 NOTE — Patient Outreach (Signed)
Patient moved to [facility] and can't make it to Colonoscopy. Wanted Melanie's number so they can call Melanie.

## 2021-02-21 ENCOUNTER — Other Ambulatory Visit: Payer: Self-pay

## 2021-02-21 ENCOUNTER — Telehealth: Payer: Self-pay | Admitting: *Deleted

## 2021-02-21 ENCOUNTER — Other Ambulatory Visit: Payer: Medicaid Other | Admitting: *Deleted

## 2021-02-21 NOTE — Patient Outreach (Signed)
Care Coordination  02/21/2021  Coda Filler 04-Jun-1981 [id number]  RNCM returning call to patient. No answer. RNCM left a detailed HIPAA compliant message, including RNCM contact information [phone].  [name] RN, BSN [place]  [employer]

## 2021-02-21 NOTE — Patient Outreach (Signed)
Care Coordination  02/21/2021  Takelia Urieta 05-12-1981 [id number]  Call from patient requesting advice for upcoming scheduled Endoscopy procedure. Patient concerned as she has a stress test scheduled in July, asking RNCM if it is safe to have the endoscopy procedure before having the stress test. RNCM explained to patient that she would need to call her GI provider with this concern. Encouraged patient to call today for upcoming endoscopy scheduled on 6/30. Patient also asking if we could provide an Aide to go with her to the procedure and stay with her for the 24 hours after scheduled procedure. RNCM explained that we did not provide this service and recommend she reach out to family, friends or coworkers for help. RNCM scheduled a follow up visit with patient for 03/08/21 @ 3:30pm. Patient agreed to this date and time.

## 2021-03-08 ENCOUNTER — Other Ambulatory Visit: Payer: Self-pay

## 2021-03-08 ENCOUNTER — Other Ambulatory Visit: Payer: Self-pay | Admitting: *Deleted

## 2021-03-08 NOTE — Patient Outreach (Signed)
Medicaid Managed Care   Nurse Care Manager Note  03/08/2021 Name:  Kalayla Shadden MRN:  [id number] DOB:  01-Jun-1981  Charlaine Utsey is an 40 y.o. year old adult who is a primary patient of System, Provider Not In.  The [facility] Managed Care Coordination team was consulted for assistance with:    DMII  Mr. Kretz was given information about Medicaid Managed Care Coordination team services today. [name] agreed to services and verbal consent obtained.  Engaged with patient by telephone for follow up visit in response to provider referral for case management and/or care coordination services.   Assessments/Interventions:  Review of past medical history, allergies, medications, health status, including review of consultants reports, laboratory and other test data, was performed as part of comprehensive evaluation and provision of chronic care management services.  SDOH (Social Determinants of Health) assessments and interventions performed: SDOH Interventions    Flowsheet Row Most Recent Value  SDOH Interventions   Housing Interventions --  [Patient currently living with family]  Transportation Interventions Other (Comment)  [Medical transportation provided by [employer] [phone]]       Care Plan  Allergies  Allergen Reactions   Januvia [Sitagliptin] Diarrhea   Other Itching    Tomato    Vitamin D Analogs Swelling    Throat closing    Medications Reviewed Today     Reviewed by Tery Sanfilippo, CPhT (Pharmacy Technician) on 02/03/21 at [place] List Status: Complete   Medication Order Taking? Sig Documenting Provider Last Dose Status Informant  baclofen (LIORESAL) 10 MG tablet [id number] No Take 10 mg by mouth 3 (three) times daily.  Patient not taking: No sig reported   [provider] Not Taking Unknown time Consider Medication Status and Discontinue Self  cetirizine (ZYRTEC) 10 MG tablet [id number] No Take 1 tablet (10 mg total) by mouth daily.   Patient not taking: No sig reported   [name], MD Not Taking Unknown time Consider Medication Status and Discontinue Self  escitalopram (LEXAPRO) 20 MG tablet [id number] Yes Take 20 mg by mouth daily. [provider] 02/02/2021 Unknown time Active Self  fluticasone (FLONASE) 50 MCG/ACT nasal spray [id number] Yes Place 1 spray into both nostrils daily. [name], MD 02/02/2021 Unknown time Active Self  glimepiride (AMARYL) 4 MG tablet [id number] Yes Take 4 mg by mouth daily with breakfast. [provider] 02/02/2021 Unknown time Active Self  levothyroxine (SYNTHROID) 150 MCG tablet [id number] Yes Take 175 mcg by mouth daily before breakfast. [provider] 02/02/2021 Unknown time Active Self           Med Note [name], XYRISH   Sun Feb 03, 2021 10:04 AM)    losartan (COZAAR) 50 MG tablet [id number] Yes Take 50 mg by mouth daily. [provider] 02/02/2021 Unknown time Active Self  metFORMIN (GLUCOPHAGE-XR) 500 MG 24 hr tablet [id number] Yes Take 500 mg by mouth daily with breakfast. [provider] 02/02/2021 Unknown time Active Self  nystatin (MYCOSTATIN/NYSTOP) powder [id number] Yes Apply 1 application topically 2 (two) times daily. [provider] 02/02/2021 Unknown time Active Self  pioglitazone (ACTOS) 30 MG tablet [id number] Yes Take 30 mg by mouth daily. [provider] 02/02/2021 Unknown time Active Self  rosuvastatin (CRESTOR) 10 MG tablet [id number] Yes Take 20 mg by mouth daily.  [provider] 02/02/2021 Unknown time Active Self  tiZANidine (ZANAFLEX) 4 MG tablet [id number] No Take 1 tablet (4 mg total) by mouth every 6 (six) hours as needed for muscle spasms.  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   Flexural atopic dermatitis 12/03/2019   Chronic rhinitis 12/03/2019   Current smoker 12/03/2019    Conditions to be addressed/monitored per PCP order:  DMII  Care Plan : Diabetes Type 2 (Adult)  Updates made by Melissa Montane, RN since 03/08/2021 12:00 AM     Problem: Glycemic Management (Diabetes, Type 2)      Long-Range Goal: Glycemic Management Optimized   Start Date: 06/07/2020  Expected End Date: 06/07/2021  This Visit's Progress: Not on track  Recent Progress: On track  Priority: High  Note:   CARE PLAN ENTRY Medicaid Managed Care (see longtitudinal plan of care for additional care plan information)  Objective:   Lab Results  Component Value Date   CREATININE 0.57 11/18/2019   CREATININE 0.73 02/17/2019   HgbA1C found in Care Everywhere from 10/2020 was 9.8 Patient reported cbg findings: fasting 140-150   Current Barriers:  Knowledge Deficits related to basic Diabetes pathophysiology and self care/management. Patient reports that it has been a long time since she had diabetes education. She would like to improve her readings and is interested in information on diabetic diet. Patient would like next HgbA1C to be 8 or less. Patient doing better with meal choices, but continues to eat fast food due to busy schedule.Patient reports really trying to make better meal choices. Watched the Emmi video, has cut out potatoes and soda. Planning to start hiking when the weather warms up.-Update-Patient is dealing with a recent break up and had to move. Patient is living with a cousin in [place] and may possibly be moving to [place] with his mother. May need to change providers due to location of new address. Patient does not have all prescribed medications and has questions regarding medications. Patient has scheduled stress test on 7/27 and rescheduled endoscopy on 8/12. Does not contact provider  with questions or concerns Case Manager Clinical Goal(s):  patient will demonstrate improved adherence to prescribed treatment plan for diabetes self care/management as evidenced by:  daily monitoring and recording of CBG and report any findings over 350 to MD  adherence to ADA/ carb modified diet-Planning to stop buying/eating potatoes exercise 7 days/week-Met-patient reports exercising everyday adherence to prescribed medication regimen Interventions:  Provided education to patient about basic DM disease process Discussed plans with patient for ongoing care management follow up and provided patient with direct contact information for care management team Reviewed scheduled/upcoming provider appointments including: 7/27 for Stress Test and 8/12 for endoscopy Referral made to MM Pharmacist for assistance with medication management Provided patient with contact information for medical transportation provided by [facility] [phone], call 2-3 days before scheduled appointment Discussed healthy benefits provided by [facility] [email or web address] Discussed diabetic diet and food choices Provided therapeutic listening and encouragement Patient Self Care Activities:  Self administers oral medications as prescribed Attends all scheduled provider appointments Checks blood sugars as prescribed and utilize hyper and hypoglycemia protocol as needed Adheres to prescribed ADA/carb modified Exercise for 30 min a day x 5 days a week  RNCM will follow up with a telephone visit on 04/10/21 @ 2:30pm      Follow Up:  Patient agrees to Care Plan and Follow-up.  Plan: The Managed Medicaid care management team will reach out to the patient again over the next 30 days.  Date/time of next scheduled RN care management/care coordination outreach:  04/10/21 @ 2:30pm  Lurena Joiner RN, [place] RN Care Coordinator

## 2021-03-08 NOTE — Patient Instructions (Signed)
Visit Information  [name] was given information about Medicaid Managed Care team care coordination services as a part of their [facility] Medicaid benefit. Pamela Rivers verbally consented to engagement with the [facility] Managed Care team.   For questions related to your [facility] health plan, please call: [phone] or go here:[email or web address]  If you would like to schedule transportation through your [facility] plan, please call the following number at least 2 days in advance of your appointment: [phone].  Call the Pamela Rivers at [phone], at any time, 24 hours a day, 7 days a week. If you are in danger or need immediate medical attention call 911.  If you would like help to quit smoking, call 1-800-QUIT-NOW 6106545364) OR Espaol: 1-855-Djelo-Ya (6-389-373-4287) o para ms informacin haga clic aqu or Text READY to 200-400 to register via text  [name] - following are the goals we discussed in your visit today:   Goals Addressed             This Visit's Progress    Monitor and Manage My Blood Sugar-Diabetes Type 2       Timeframe:  Long-Range Goal Priority:  High Start Date:    06/07/20                         Expected End Date:   06/07/21               Work with MM Pharmacist for medication management  Self administers oral medications as prescribed Attends all scheduled provider appointments Checks blood sugars as prescribed and utilize hyper and hypoglycemia protocol as needed Adheres to prescribed ADA/carb modified Exercise for 30 min a day x 5 days a week     Why is this important?   Checking your blood sugar at home helps to keep it from getting very high or very low.  Writing the results in a diary or log helps the doctor know how to care for you.  Your blood sugar log should have the time, date and the results.  Also, write down the amount of insulin or other medicine that you take.  Other  information, like what you ate, exercise done and how you were feeling, will also be helpful.              Please see education materials related to diabetes provided as print materials.   The patient verbalized understanding of instructions provided today and agreed to receive a mailed copy of patient instruction and/or educational materials.  Telephone follow up appointment with Managed Medicaid care management team member scheduled for:04/10/21 @ 2:30pm  Pamela Joiner RN, [place] RN Care Coordinator   Following is a copy of your plan of care:  Patient Care Plan: Diabetes Type 2 (Adult)     Problem Identified: Glycemic Management (Diabetes, Type 2)      Long-Range Goal: Glycemic Management Optimized   Start Date: 06/07/2020  Expected End Date: 06/07/2021  This Visit's Progress: Not on track  Recent Progress: On track  Priority: High  Note:   CARE PLAN ENTRY Medicaid Managed Care (see longtitudinal plan of care for additional care plan information)  Objective:   Lab Results  Component Value Date   CREATININE 0.57 11/18/2019   CREATININE 0.73 02/17/2019   HgbA1C found in Care Everywhere from 10/2020 was 9.8 Patient reported cbg findings: fasting 140-150   Current Barriers:  Knowledge Deficits related to basic  Diabetes pathophysiology and self care/management. Patient reports that it has been a long time since she had diabetes education. She would like to improve her readings and is interested in information on diabetic diet. Patient would like next HgbA1C to be 8 or less. Patient doing better with meal choices, but continues to eat fast food due to busy schedule.Patient reports really trying to make better meal choices. Watched the Emmi video, has cut out potatoes and soda. Planning to start hiking when the weather warms up.-Update-Patient is dealing with a recent break up and had to move. Patient is living with a cousin in [place] and  may possibly be moving to [place] with his mother. May need to change providers due to location of new address. Patient does not have all prescribed medications and has questions regarding medications. Patient has scheduled stress test on 7/27 and rescheduled endoscopy on 8/12. Does not contact provider with questions or concerns Case Manager Clinical Goal(s):  patient will demonstrate improved adherence to prescribed treatment plan for diabetes self care/management as evidenced by:  daily monitoring and recording of CBG and report any findings over 350 to MD  adherence to ADA/ carb modified diet-Planning to stop buying/eating potatoes exercise 7 days/week-Met-patient reports exercising everyday adherence to prescribed medication regimen Interventions:  Provided education to patient about basic DM disease process Discussed plans with patient for ongoing care management follow up and provided patient with direct contact information for care management team Reviewed scheduled/upcoming provider appointments including: 7/27 for Stress Test and 8/12 for endoscopy Referral made to MM Pharmacist for assistance with medication management Provided patient with contact information for medical transportation provided by [facility] [phone], call 2-3 days before scheduled appointment Discussed healthy benefits provided by [facility] [email or web address] Discussed diabetic diet and food choices Provided therapeutic listening and encouragement Patient Self Care Activities:  Self administers oral medications as prescribed Attends all scheduled provider appointments Checks blood sugars as prescribed and utilize hyper and hypoglycemia protocol as needed Adheres to prescribed ADA/carb modified Exercise for 30 min a day x 5 days a week  RNCM will follow up with a telephone visit on 04/10/21 @ 2:30pm

## 2021-03-18 ENCOUNTER — Ambulatory Visit: Payer: Medicaid Other

## 2021-03-19 ENCOUNTER — Other Ambulatory Visit: Payer: Self-pay

## 2021-03-19 NOTE — Patient Instructions (Signed)
Visit Information  [name] was given information about Medicaid Managed Care team care coordination services as a part of their [facility] Medicaid benefit. Pamela Rivers verbally consented to engagement with the [facility] Managed Care team.   For questions related to your [facility] health plan, please call: [phone] or go here:[email or web address]  If you would like to schedule transportation through your [facility] plan, please call the following number at least 2 days in advance of your appointment: [phone].  Call the [place] at [phone], at any time, 24 hours a day, 7 days a week. If you are in danger or need immediate medical attention call 911.  If you would like help to quit smoking, call 1-800-QUIT-NOW 510-181-6264) OR Espaol: 1-855-Djelo-Ya (6-834-196-2229) o para ms informacin haga clic aqu or Text READY to 200-400 to register via text  [name] - following are the goals we discussed in your visit today:   Goals Addressed   None     Please see education materials related to DM provided as print materials.   Patient verbalizes understanding of instructions provided today.   The Managed Medicaid care management team will reach out to the patient again over the next 30 days.   [place] Constable, Pharm.D., Managed Medicaid Pharmacist [phone]   Following is a copy of your plan of care:  Patient Care Plan: Diabetes Type 2 (Adult)     Problem Identified: Glycemic Management (Diabetes, Type 2)      Long-Range Goal: Glycemic Management Optimized   Start Date: 06/07/2020  Expected End Date: 06/07/2021  This Visit's Progress: Not on track  Recent Progress: On track  Priority: High  Note:   CARE PLAN ENTRY Medicaid Managed Care (see longtitudinal plan of care for additional care plan information)  Objective:   Lab Results  Component Value Date   CREATININE 0.57 11/18/2019   CREATININE 0.73  02/17/2019   HgbA1C found in Care Everywhere from 10/2020 was 9.8 Patient reported cbg findings: fasting 140-150   Current Barriers:  Knowledge Deficits related to basic Diabetes pathophysiology and self care/management. Patient reports that it has been a long time since she had diabetes education. She would like to improve her readings and is interested in information on diabetic diet. Patient would like next HgbA1C to be 8 or less. Patient doing better with meal choices, but continues to eat fast food due to busy schedule.Patient reports really trying to make better meal choices. Watched the Emmi video, has cut out potatoes and soda. Planning to start hiking when the weather warms up.-Update-Patient is dealing with a recent break up and had to move. Patient is living with a cousin in [place] and may possibly be moving to [place] with his mother. May need to change providers due to location of new address. Patient does not have all prescribed medications and has questions regarding medications. Patient has scheduled stress test on 7/27 and rescheduled endoscopy on 8/12. Does not contact provider with questions or concerns Case Manager Clinical Goal(s):  patient will demonstrate improved adherence to prescribed treatment plan for diabetes self care/management as evidenced by:  daily monitoring and recording of CBG and report any findings over 350 to MD  adherence to ADA/ carb modified diet-Planning to stop buying/eating potatoes exercise 7 days/week-Met-patient reports exercising everyday adherence to prescribed medication regimen Interventions:  Provided education to patient about basic DM disease process Discussed plans with patient for ongoing care management follow up and provided patient with direct contact information for  care management team Reviewed scheduled/upcoming provider appointments including: 7/27 for Stress Test and 8/12 for endoscopy Referral made to MM Pharmacist for  assistance with medication management Provided patient with contact information for medical transportation provided by [facility] [phone], call 2-3 days before scheduled appointment Discussed healthy benefits provided by [facility] [email or web address] Discussed diabetic diet and food choices Provided therapeutic listening and encouragement Patient Self Care Activities:  Self administers oral medications as prescribed Attends all scheduled provider appointments Checks blood sugars as prescribed and utilize hyper and hypoglycemia protocol as needed Adheres to prescribed ADA/carb modified Exercise for 30 min a day x 5 days a week  RNCM will follow up with a telephone visit on 04/10/21 @ 2:30pm

## 2021-03-19 NOTE — Patient Outreach (Addendum)
Patient is incredibly non-compliant on meds and moved to a new house and can't get meds at their new location (Also no longer has a car) Plan: Verbal consent obtained for [facility] enhanced pharmacy services (medication synchronization, adherence packaging, delivery coordination). A medication sync plan was created to allow patient to get all medications delivered once every 30 to 90 days per patient preference. Patient understands they have freedom to choose pharmacy and clinical pharmacist will coordinate care between all prescribers and [facility].   TS/Lancets (Accu Chek Guide)  JOBE,DANIEL BRIAN       04/02/21  Rosuvastatin 40mg   JOBE,DANIEL BRIAN     1 08/14/20 for 90 04/02/21  Pioglitazone 30mg   JOBE,DANIEL BRIAN     1 01/31/21 for 30 days 04/02/21  Losartan 50mg   JOBE,DANIEL BRIAN     1 10/03/20 for 90 days 04/02/21  Glimepiride 2mg  (Vials)  JOBE,DANIEL BRIAN  1    12/26/20 for 90  04/02/21  Levothyroxine 06/02/21  JOBE,DANIEL BRIAN  1    12/26/20 for 90 04/02/21  Escitalopram 20mg   JOBE,DANIEL BRIAN     1 01/07/21 for 90 days 04/02/21  Metformin 500mg  ER  JOBE,DANIEL BRIAN  1    12/26/20 for 90 days 04/02/21  Vit D3 (Patient is allergic but will take a tablet at the provider's office to test), only needs one tablet delivered  JOBE,DANIEL BRIAN       04/02/21  Ketoconazole 2% cream  JOBE,DANIEL BRIAN       ASAP, whenever the soonest we can deliver is    Patient has no PCP so all meds have to be transferred, can't have new scripts until new PCP made. Wants all meds delivered together and at the same time on 04/02/21. Allergic to Vit D3 but per patient, the provider wants 06/02/21 to start back on. will take 1 pill while at their new PCP's office to re-test the allergy. PATIENT WILL NOT TAKE THE MED ALONE WITHOUT MEDICAL SUPERVISION

## 2021-04-10 ENCOUNTER — Other Ambulatory Visit: Payer: Self-pay

## 2021-04-10 ENCOUNTER — Other Ambulatory Visit: Payer: Self-pay | Admitting: *Deleted

## 2021-04-10 DIAGNOSIS — Z599 Problem related to housing and economic circumstances, unspecified: Secondary | ICD-10-CM

## 2021-04-10 DIAGNOSIS — Z59819 Housing instability, housed unspecified: Secondary | ICD-10-CM

## 2021-04-10 NOTE — Patient Outreach (Signed)
Medicaid Managed Care   Nurse Care Manager Note  04/10/2021 Name:  Pamela Rivers MRN:  [id number] DOB:  12/01/1980  Pamela Rivers is an 40 y.o. year old adult who is a primary Pamela Rivers of [facility], Provider Not In.  The [facility] Managed Care Coordination team was consulted for assistance with:    DMII and chest pain  [name] was given information about Medicaid Managed Care Coordination team services today. Pamela Rivers Pamela Rivers agreed to services and verbal consent obtained.  Engaged with Pamela Rivers by telephone for follow up visit in response to provider referral for case management and/or care coordination services.   Assessments/Interventions:  Review of past medical history, allergies, medications, health status, including review of consultants reports, laboratory and other test data, was performed as part of comprehensive evaluation and provision of chronic care management services.  SDOH (Social Determinants of Health) assessments and interventions performed: SDOH Interventions    Flowsheet Row Most Recent Value  SDOH Interventions   Housing Interventions Other (Comment)  [Care Guide referral]       Care Plan  Allergies  Allergen Reactions   Januvia [Sitagliptin] Diarrhea   Other Itching    Tomato    Vitamin D Analogs Swelling    Throat closing    Medications Reviewed Today     Reviewed by Melissa Montane, RN (Registered Nurse) on 04/10/21 at 1529  Med List Status: <None>   Medication Order Taking? Sig Documenting Provider Last Dose Status Informant  baclofen (LIORESAL) 10 MG tablet [id number] No Take 10 mg by mouth 3 (three) times daily.  Pamela Rivers not taking: No sig reported   [provider] Not Taking Unknown time Active Self  cetirizine (ZYRTEC) 10 MG tablet [id number] No Take 1 tablet (10 mg total) by mouth daily.  Pamela Rivers not taking: No sig reported   Pamela Shaggy, MD Not Taking Unknown time Active Self  escitalopram (LEXAPRO) 20 MG  tablet [id number] No Take 20 mg by mouth daily. [provider] 02/02/2021 Unknown time Active Self  fluticasone (FLONASE) 50 MCG/ACT nasal spray [id number] No Place 1 spray into both nostrils daily. Pamela Shaggy, MD 02/02/2021 Unknown time Active Self  glimepiride (AMARYL) 4 MG tablet [id number] No Take 4 mg by mouth daily with breakfast. [provider] 02/02/2021 Unknown time Active Self  levothyroxine (SYNTHROID) 150 MCG tablet [id number] No Take 175 mcg by mouth daily before breakfast. [provider] 02/02/2021 Unknown time Active Self           Med Note Pamela Rivers, XYRISH   Sun Rivers 5, 2022 10:04 AM)    losartan (COZAAR) 50 MG tablet [id number] No Take 50 mg by mouth daily. [provider] 02/02/2021 Unknown time Active Self  metFORMIN (GLUCOPHAGE-XR) 500 MG 24 hr tablet [id number] No Take 500 mg by mouth daily with breakfast. [provider] 02/02/2021 Unknown time Active Self  nystatin (MYCOSTATIN/NYSTOP) powder [id number] No Apply 1 application topically 2 (two) times daily. [provider] 02/02/2021 Unknown time Active Self  pioglitazone (ACTOS) 30 MG tablet [id number] No Take 30 mg by mouth daily. [provider] 02/02/2021 Unknown time Active Self  rosuvastatin (CRESTOR) 10 MG tablet [id number] No Take 20 mg by mouth daily.  [provider] 02/02/2021 Unknown time Active Self  tiZANidine (ZANAFLEX) 4 MG tablet [id number] No Take 1 tablet (4 mg total) by mouth every 6 (six) hours as needed for muscle spasms.  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   Flexural atopic dermatitis 12/03/2019   Chronic rhinitis 12/03/2019   Current smoker 12/03/2019    Conditions to be  addressed/monitored per PCP order:  DMII and chest pain  Care Plan : Diabetes Type 2 (Adult)  Updates made by Melissa Montane, RN since 04/10/2021 12:00 AM     Problem: Glycemic Management (Diabetes, Type 2)      Long-Range Goal: Glycemic Management Optimized   Start Date: 06/07/2020  Expected End Date: 06/07/2021  Recent Progress: Not on track  Priority: High  Note:   CARE PLAN ENTRY Medicaid Managed Care (see longtitudinal plan of care for additional care plan information)  Objective:   Lab Results  Component Value Date   CREATININE 0.57 11/18/2019   CREATININE 0.73 02/17/2019   HgbA1C found in Care Everywhere from 10/2020 was 9.8 Pamela Rivers reported cbg findings: fasting 140-150   Current Barriers:  Knowledge Deficits related to basic Diabetes pathophysiology and self care/management. Pamela Rivers reports that it has been a long time since she had diabetes education. She would like to improve her readings and is interested in information on diabetic diet. Pamela Rivers would like next HgbA1C to be 8 or less. Pamela Rivers doing better with meal choices, but continues to eat fast food due to busy schedule.Pamela Rivers reports really trying to make better meal choices. Watched the Emmi video, has cut out potatoes and soda. Planning to start hiking when the weather warms up.-Update-Pamela Rivers is dealing with a recent break up and had to move. Pamela Rivers is living with a cousin in [place] and may possibly be moving to [place] with his mother. May need to change providers due to location of new address. Pamela Rivers does not have all prescribed medications and has questions regarding medications. Pamela Rivers has scheduled stress test on 7/27 and rescheduled endoscopy on 8/12._Update- Pamela Rivers with recent chest pain, currently wearing holter monitor and has cardiac cath scheduled for 8/16. Endoscopy procedure is on hold at this time. Pamela Rivers is currently couch surfing and would like assistance with housing options. In between  insurance currently as she was going to move to [place], but has since changed her mind. She is in the process of having her insurance plan reinstated. Does not contact provider with questions or concerns Case Manager Clinical Goal(s):  Pamela Rivers will demonstrate improved adherence to prescribed treatment plan for diabetes self care/management as evidenced by:  daily monitoring and recording of CBG and report any findings over 350 to MD  adherence to ADA/ carb modified diet-Planning to stop buying/eating potatoes exercise 7 days/week-Met-Pamela Rivers reports exercising everyday adherence to prescribed medication regimen Interventions:  Provided education to Pamela Rivers about heart healthy and diabetic diet Discussed plans with Pamela Rivers for ongoing care management follow up and provided Pamela Rivers with direct contact information for care management team Reviewed scheduled/upcoming provider appointments including: 8/16 for Cardiac Cath Referral made to Care Guide to assist with new PCP and housing options/assistance Discussed diabetic diet and food choices Provided therapeutic listening and encouragement Pamela Rivers Self Care Activities:  Find a PCP and schedule an appointment Work with MM Pharmacist for medication management  Self administers oral medications as prescribed Attends all scheduled provider appointments: Cardiac Cath 8/16 Checks blood sugars as prescribed and utilize hyper and hypoglycemia protocol as needed  Adheres to prescribed ADA/carb modified Exercise for 30 min a day x 5 days a week  RNCM will follow up with a telephone visit on 05/13/21 @ 3:30pm      Follow Up:  Pamela Rivers agrees to Care Plan and Follow-up.  Plan: The Managed Medicaid care management team will reach out to the Pamela Rivers again over the next 30 days.  Date/time of next scheduled RN care management/care coordination outreach:  05/13/21 @ 3:30pm  Lurena Joiner RN, BSN [place] RN Care Coordinator

## 2021-04-10 NOTE — Patient Instructions (Signed)
Visit Information  Pamela Rivers was given information about Medicaid Managed Care team care coordination services as a part of their [facility] Medicaid benefit. Pamela Rivers verbally consented to engagement with the [facility] Managed Care team.   If you are experiencing a medical emergency, please call 911 or report to your local emergency department or urgent care.   If you have a non-emergency medical problem during routine business hours, please contact your provider's office and ask to speak with a nurse.   For questions related to your [facility] health plan, please call: [phone] or go here:[email or web address]  If you would like to schedule transportation through your [facility] plan, please call the following number at least 2 days in advance of your appointment: [phone].  Call the [facility] at [phone], at any time, 24 hours a day, 7 days a week. If you are in danger or need immediate medical attention call 911.  If you would like help to quit smoking, call 1-800-QUIT-NOW 812-069-4414) OR Espaol: 1-855-Djelo-Ya (8-115-726-2035) o para ms informacin haga clic aqu or Text READY to 200-400 to register via text  Pamela Rivers - following are the goals we discussed in your visit today:   Goals Addressed             This Visit's Progress    Monitor and Manage My Blood Sugar-Diabetes Type 2       Timeframe:  Long-Range Goal Priority:  High Start Date:    06/07/20                         Expected End Date:   05/13/21       Follow up 05/13/21         Find a PCP and schedule an appointment Work with MM Pharmacist for medication management  Self administers oral medications as prescribed Attends all scheduled provider appointments: Cardiac Cath 8/16 Checks blood sugars as prescribed and utilize hyper and hypoglycemia protocol as needed Adheres to prescribed ADA/carb modified Exercise for 30 min a day x 5 days a week      Why is this important?   Checking your blood sugar at home helps to keep it from getting very high or very low.  Writing the results in a diary or log helps the doctor know how to care for you.  Your blood sugar log should have the time, date and the results.  Also, write down the amount of insulin or other medicine that you take.  Other information, like what you ate, exercise done and how you were feeling, will also be helpful.             Please see education materials related to diabetes and heart care provided by MyChart link.  Patient verbalizes understanding of instructions provided today.   Telephone follow up appointment with Managed Medicaid care management team member scheduled for:05/13/21 @ 3:30pm  Lurena Joiner RN, [place] RN Care Coordinator   Following is a copy of your plan of care:  Patient Care Plan: Diabetes Type 2 (Adult)     Problem Identified: Glycemic Management (Diabetes, Type 2)      Long-Range Goal: Glycemic Management Optimized   Start Date: 06/07/2020  Expected End Date: 06/07/2021  Recent Progress: Not on track  Priority: High  Note:   CARE PLAN ENTRY Medicaid Managed Care (see longtitudinal plan of care for additional care plan information)  Objective:   Lab  Results  Component Value Date   CREATININE 0.57 11/18/2019   CREATININE 0.73 02/17/2019   HgbA1C found in Care Everywhere from 10/2020 was 9.8 Patient reported cbg findings: fasting 140-150   Current Barriers:  Knowledge Deficits related to basic Diabetes pathophysiology and self care/management. Patient reports that it has been a long time since she had diabetes education. She would like to improve her readings and is interested in information on diabetic diet. Patient would like next HgbA1C to be 8 or less. Patient doing better with meal choices, but continues to eat fast food due to busy schedule.Patient reports really trying to make better meal  choices. Watched the Emmi video, has cut out potatoes and soda. Planning to start hiking when the weather warms up.-Update-Patient is dealing with a recent break up and had to move. Patient is living with a cousin in [place] and may possibly be moving to [place] with his mother. May need to change providers due to location of new address. Patient does not have all prescribed medications and has questions regarding medications. Patient has scheduled stress test on 7/27 and rescheduled endoscopy on 8/12._Update- Patient with recent chest pain, currently wearing holter monitor and has cardiac cath scheduled for 8/16. Endoscopy procedure is on hold at this time. Patient is currently couch surfing and would like assistance with housing options. In between insurance currently as she was going to move to [place], but has since changed her mind. She is in the process of having her insurance plan reinstated. Does not contact provider with questions or concerns Case Manager Clinical Goal(s):  patient will demonstrate improved adherence to prescribed treatment plan for diabetes self care/management as evidenced by:  daily monitoring and recording of CBG and report any findings over 350 to MD  adherence to ADA/ carb modified diet-Planning to stop buying/eating potatoes exercise 7 days/week-Met-patient reports exercising everyday adherence to prescribed medication regimen Interventions:  Provided education to patient about heart healthy and diabetic diet Discussed plans with patient for ongoing care management follow up and provided patient with direct contact information for care management team Reviewed scheduled/upcoming provider appointments including: 8/16 for Cardiac Cath Referral made to Care Guide to assist with new PCP and housing options/assistance Discussed diabetic diet and food choices Provided therapeutic listening and encouragement Patient Self Care Activities:  Find a PCP and schedule an  appointment Work with MM Pharmacist for medication management  Self administers oral medications as prescribed Attends all scheduled provider appointments: Cardiac Cath 8/16 Checks blood sugars as prescribed and utilize hyper and hypoglycemia protocol as needed Adheres to prescribed ADA/carb modified Exercise for 30 min a day x 5 days a week  RNCM will follow up with a telephone visit on 05/13/21 @ 3:30pm

## 2021-04-23 ENCOUNTER — Telehealth: Payer: Self-pay | Admitting: *Deleted

## 2021-04-23 NOTE — Telephone Encounter (Signed)
   Telephone encounter was:  Unsuccessful.  04/23/2021 Name: Pamela Rivers MRN: [id number] DOB: 1981/03/03  Unsuccessful outbound call made today to assist with:   finding a PCP and housing.  Outreach Attempt:  1st Attempt  A HIPAA compliant voice message was left requesting a return call.  Instructed patient to call back at [phone].  April Green Care Guide, Embedded Care Coordination [facility], Care Management Phone: [phone] Email: [email or web address]

## 2021-05-13 ENCOUNTER — Other Ambulatory Visit: Payer: Self-pay | Admitting: *Deleted

## 2021-05-13 ENCOUNTER — Other Ambulatory Visit: Payer: Self-pay

## 2021-05-13 NOTE — Patient Instructions (Signed)
Visit Information  [name] was given information about Medicaid Managed Care team care coordination services as a part of their [facility] Medicaid benefit. Pamela Rivers verbally consented to engagement with the [facility] Managed Care team.   If you are experiencing a medical emergency, please call 911 or report to your local emergency department or urgent care.   If you have a non-emergency medical problem during routine business hours, please contact your provider's office and ask to speak with a nurse.   For questions related to your [facility] health plan, please call: [phone] or go here:[email or web address]  If you would like to schedule transportation through your [facility] plan, please call the following number at least 2 days in advance of your appointment: [phone].  Call the [place] at [phone], at any time, 24 hours a day, 7 days a week. If you are in danger or need immediate medical attention call 911.  If you would like help to quit smoking, call 1-800-QUIT-NOW 5417540967) OR Espaol: 1-855-Djelo-Ya (5-320-233-4356) o para ms informacin haga clic aqu or Text READY to 200-400 to register via text  [name] - following are the goals we discussed in your visit today:   Goals Addressed             This Visit's Progress    Monitor and Manage My Blood Sugar-Diabetes Type 2       Timeframe:  Long-Range Goal Priority:  High Start Date:    06/07/20                         Expected End Date:   05/29/21       Follow up 05/29/21         Find a PCP in your area and schedule an appointment-take current list of medications Schedule and attend dental appointment Work with [facility] Pharmacist for medication management  Self administers oral medications as prescribed Attends all scheduled provider appointments: Cardiac Cath follow up 9/20 Checks blood sugars as prescribed and utilize hyper and hypoglycemia protocol as  needed Adheres to prescribed ADA/carb modified Exercise for 30 min a day x 5 days a week     Why is this important?   Checking your blood sugar at home helps to keep it from getting very high or very low.  Writing the results in a diary or log helps the doctor know how to care for you.  Your blood sugar log should have the time, date and the results.  Also, write down the amount of insulin or other medicine that you take.  Other information, like what you ate, exercise done and how you were feeling, will also be helpful.             Please see education materials related to hyperglycemia provided by MyChart link.  Patient has access to MyChart and can view provided education   Telephone follow up appointment with Managed Medicaid care management team member scheduled for:05/29/21 @ 2:30pm  Lurena Joiner RN, BSN [place] RN Care Coordinator   Following is a copy of your plan of care:  Patient Care Plan: Diabetes Type 2 (Adult)     Problem Identified: Glycemic Management (Diabetes, Type 2)      Long-Range Goal: Glycemic Management Optimized   Start Date: 06/07/2020  Expected End Date: 06/07/2021  Recent Progress: Not on track  Priority: High  Note:   CARE PLAN ENTRY Medicaid Managed Care (see  longtitudinal plan of care for additional care plan information)  Objective:   Lab Results  Component Value Date   CREATININE 0.57 11/18/2019   CREATININE 0.73 02/17/2019   HgbA1C found in Care Everywhere from 10/2020 was 9.8 Patient reported cbg findings: fasting 140-150   Current Barriers:  Knowledge Deficits related to basic Diabetes pathophysiology and self care/management. Patient reports that it has been a long time since she had diabetes education. She would like to improve her readings and is interested in information on diabetic diet. Patient would like next HgbA1C to be 8 or less. Patient doing better with meal choices, but continues to  eat fast food due to busy schedule.Patient reports really trying to make better meal choices. Watched the Emmi video, has cut out potatoes and soda. Planning to start hiking when the weather warms up. Patient is dealing with a recent break up and had to move. Patient does not have all prescribed medications and has questions regarding medications. Patient has scheduled stress test on 7/27 and rescheduled endoscopy on 8/12. Patient with recent chest pain, currently wearing holter monitor and has cardiac cath scheduled for 8/16. Endoscopy procedure is on hold at this time. Patient is currently couch surfing and would like assistance with housing options. In between insurance currently as she was going to move to [place], but has since changed her mind. She is in the process of having her insurance plan reinstated.-Update-Patient now living in [place], [place], renting a room from a relative. She needs a PCP close to home, but is waiting until her cardiac cath follow up appointment on 9/20. She is running low on her diabetes medications and will need a refill. She reports one reading of 290 which came down after drinking water and resting. Today she is having dental concerns and would like to have a dental exam. Does not contact provider with questions or concerns Case Manager Clinical Goal(s):  patient will demonstrate improved adherence to prescribed treatment plan for diabetes self care/management as evidenced by:  daily monitoring and recording of CBG and report any findings over 350 to MD  adherence to ADA/ carb modified diet-Planning to stop buying/eating potatoes exercise 7 days/week-Met-patient reports exercising everyday adherence to prescribed medication regimen Interventions:  Provided education to patient about heart healthy and diabetic diet Discussed plans with patient for ongoing care management follow up and provided patient with direct contact information for care management team Reviewed  scheduled/upcoming provider appointments including: 9/20 for Cardiac Cath follow up  Provided a dental option from [facility] website: Dr. Lauretta Grill [phone] Provided education on hyperglycemia Collaborate with MM Pharmacist, Ernst Bowler for medication management Provided therapeutic listening and encouragement Patient Self Care Activities:  Find a PCP in your area and schedule an appointment-take current list of medications Schedule and attend dental appointment Work with MM Pharmacist for medication management  Self administers oral medications as prescribed Attends all scheduled provider appointments: Cardiac Cath 8/16 Checks blood sugars as prescribed and utilize hyper and hypoglycemia protocol as needed Adheres to prescribed ADA/carb modified Exercise for 30 min a day x 5 days a week  RNCM will follow up with a telephone visit on 05/29/21 @ 2:30pm

## 2021-05-13 NOTE — Patient Outreach (Signed)
Medicaid Managed Care   Nurse Care Manager Note  05/13/2021 Name:  Shanessa Hodak MRN:  [id number] DOB:  11/20/1980  Aquita Simmering is an 40 y.o. year old adult who is a primary patient of System, Provider Not In.  The [facility] Managed Care Coordination team was consulted for assistance with:    DMII  Mr. Tawney was given information about Medicaid Managed Care Coordination team services today. [name] Patient agreed to services and verbal consent obtained.  Engaged with patient by telephone for follow up visit in response to provider referral for case management and/or care coordination services.   Assessments/Interventions:  Review of past medical history, allergies, medications, health status, including review of consultants reports, laboratory and other test data, was performed as part of comprehensive evaluation and provision of chronic care management services.  SDOH (Social Determinants of Health) assessments and interventions performed:   Care Plan  Allergies  Allergen Reactions   Januvia [Sitagliptin] Diarrhea   Other Itching    Tomato    Vitamin D Analogs Swelling    Throat closing    Medications Reviewed Today     Reviewed by Melissa Montane, RN (Registered Nurse) on 05/13/21 at 1558  Med List Status: <None>   Medication Order Taking? Sig Documenting Provider Last Dose Status Informant  baclofen (LIORESAL) 10 MG tablet [id number] No Take 10 mg by mouth 3 (three) times daily.  Patient not taking: No sig reported   [provider] Not Taking Active   cetirizine (ZYRTEC) 10 MG tablet [id number] No Take 1 tablet (10 mg total) by mouth daily.  Patient not taking: No sig reported   [name], MD Not Taking Active   escitalopram (LEXAPRO) 20 MG tablet [id number] No Take 20 mg by mouth daily.  Patient not taking: Reported on 05/13/2021   [provider] Not Taking Active Self  fluticasone (FLONASE) 50 MCG/ACT nasal spray [id number]  No Place 1 spray into both nostrils daily.  Patient not taking: Reported on 05/13/2021   [name], MD Not Taking Active Self  glimepiride (AMARYL) 4 MG tablet [id number] No Take 4 mg by mouth daily with breakfast.  Patient not taking: Reported on 05/13/2021   [provider] Not Taking Active Self  levothyroxine (SYNTHROID) 150 MCG tablet [id number] Yes Take 175 mcg by mouth daily before breakfast. [provider] Taking Active Self           Med Note [name], XYRISH   Sun Feb 03, 2021 10:04 AM)    losartan (COZAAR) 50 MG tablet [id number] Yes Take 50 mg by mouth daily. [provider] Taking Active Self  metFORMIN (GLUCOPHAGE-XR) 500 MG 24 hr tablet [id number] Yes Take 500 mg by mouth daily with breakfast. [provider] Taking Active Self  nystatin (MYCOSTATIN/NYSTOP) powder [id number] No Apply 1 application topically 2 (two) times daily.  Patient not taking: Reported on 05/13/2021   [provider] Not Taking Active Self  pioglitazone (ACTOS) 30 MG tablet [id number] No Take 30 mg by mouth daily.  Patient not taking: Reported on 05/13/2021   [provider] Not Taking Active Self  rosuvastatin (CRESTOR) 10 MG tablet [id number] Yes Take 20 mg by mouth daily.  [provider] Taking Active Self  tiZANidine (ZANAFLEX) 4 MG tablet [id number] No Take 1 tablet (4 mg total) by mouth every 6 (six) hours as needed for muscle spasms.  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   Flexural atopic dermatitis 12/03/2019   Chronic rhinitis 12/03/2019   Current smoker 12/03/2019    Conditions to be addressed/monitored per PCP order:  DMII  Care Plan : Diabetes Type 2 (Adult)  Updates made by  Melissa Montane, RN since 05/13/2021 12:00 AM     Problem: Glycemic Management (Diabetes, Type 2)      Long-Range Goal: Glycemic Management Optimized   Start Date: 06/07/2020  Expected End Date: 06/07/2021  Recent Progress: Not on track  Priority: High  Note:   CARE PLAN ENTRY Medicaid Managed Care (see longtitudinal plan of care for additional care plan information)  Objective:   Lab Results  Component Value Date   CREATININE 0.57 11/18/2019   CREATININE 0.73 02/17/2019   HgbA1C found in Care Everywhere from 10/2020 was 9.8 Patient reported cbg findings: fasting 140-150   Current Barriers:  Knowledge Deficits related to basic Diabetes pathophysiology and self care/management. Patient reports that it has been a long time since she had diabetes education. She would like to improve her readings and is interested in information on diabetic diet. Patient would like next HgbA1C to be 8 or less. Patient doing better with meal choices, but continues to eat fast food due to busy schedule.Patient reports really trying to make better meal choices. Watched the Emmi video, has cut out potatoes and soda. Planning to start hiking when the weather warms up. Patient is dealing with a recent break up and had to move. Patient does not have all prescribed medications and has questions regarding medications. Patient has scheduled stress test on 7/27 and rescheduled endoscopy on 8/12. Patient with recent chest pain, currently wearing holter monitor and has cardiac cath scheduled for 8/16. Endoscopy procedure is on hold at this time. Patient is currently couch surfing and would like assistance with housing options. In between insurance currently as she was going to move to [place], but has since changed her mind. She is in the process of having her insurance plan reinstated.-Update-Patient now living in [place], [place], renting a room from a relative. She needs a PCP close to home, but is waiting until her cardiac cath  follow up appointment on 9/20. She is running low on her diabetes medications and will need a refill. She reports one reading of 290 which came down after drinking water and resting. Today she is having dental concerns and would like to have a dental exam. Does not contact provider with questions or concerns Case Manager Clinical Goal(s):  patient will demonstrate improved adherence to prescribed treatment plan for diabetes self care/management as evidenced by:  daily monitoring and recording of CBG and report any findings over 350 to MD  adherence to ADA/ carb modified diet-Planning to stop buying/eating potatoes exercise 7 days/week-Met-patient reports exercising everyday adherence to prescribed medication regimen Interventions:  Provided education to patient about heart healthy and diabetic diet Discussed plans with patient for ongoing care management follow up and provided patient with direct contact information for care management team Reviewed scheduled/upcoming provider appointments including: 9/20 for Cardiac Cath follow up  Provided a dental option from [facility] website: Dr. Lauretta Grill [phone] Provided education on hyperglycemia Collaborate with MM Pharmacist, Ernst Bowler for medication management Provided therapeutic listening and encouragement Patient Self Care Activities:  Find a PCP in your area and schedule an appointment-take current  list of medications Schedule and attend dental appointment Work with MM Pharmacist for medication management  Self administers oral medications as prescribed Attends all scheduled provider appointments: Cardiac Cath 8/16 Checks blood sugars as prescribed and utilize hyper and hypoglycemia protocol as needed Adheres to prescribed ADA/carb modified Exercise for 30 min a day x 5 days a week  RNCM will follow up with a telephone visit on 05/29/21 @ 2:30pm      Follow Up:  Patient agrees to Care Plan and Follow-up.  Plan: The Managed Medicaid care  management team will reach out to the patient again over the next 15 days.  Date/time of next scheduled RN care management/care coordination outreach:  05/29/21 @ 2:30pm  Lurena Joiner RN, [place] RN Care Coordinator

## 2021-05-21 ENCOUNTER — Other Ambulatory Visit: Payer: Self-pay

## 2021-05-22 NOTE — Patient Outreach (Signed)
05/22/2021 Name: Pamela Rivers MRN: [id number] DOB: 1980/11/10  Referred by: System, Provider Not In Reason for referral : No chief complaint on file.   An unsuccessful telephone outreach was attempted today. The patient was referred to the case management team for assistance with care management and care coordination.    Follow Up Plan: The Managed Medicaid care management team will reach out to the patient again over the next 10 days. Unable to leave voicemail   Cheral Almas PharmD, CPP High Risk Managed [facility] [phone]

## 2021-05-29 ENCOUNTER — Other Ambulatory Visit: Payer: Self-pay | Admitting: *Deleted

## 2021-05-29 ENCOUNTER — Other Ambulatory Visit: Payer: Self-pay

## 2021-05-29 DIAGNOSIS — Z9189 Other specified personal risk factors, not elsewhere classified: Secondary | ICD-10-CM

## 2021-05-29 NOTE — Addendum Note (Signed)
Addended by: [name] A on: 05/29/2021 04:05 PM   Modules accepted: Orders

## 2021-05-29 NOTE — Patient Outreach (Signed)
Medicaid Managed Care   Nurse Care Manager Note  05/29/2021 Name:  Pamela Rivers MRN:  [id number] DOB:  Dec 30, 1980  Pamela Rivers is an 40 y.o. year old adult who is a primary patient of System, Provider Not In.  The [facility] Managed Care Coordination team was consulted for assistance with:    DMII  Mr. Brunn was given information about Medicaid Managed Care Coordination team services today. [name] Patient agreed to services and verbal consent obtained.  Engaged with patient by telephone for follow up visit in response to provider referral for case management and/or care coordination services.   Assessments/Interventions:  Review of past medical history, allergies, medications, health status, including review of consultants reports, laboratory and other test data, was performed as part of comprehensive evaluation and provision of chronic care management services.  SDOH (Social Determinants of Health) assessments and interventions performed: SDOH Interventions    Flowsheet Row Most Recent Value  SDOH Interventions   Social Connections Interventions Other (Comment)  [Care Guide referral for social connections]       Care Plan  Allergies  Allergen Reactions   Januvia [Sitagliptin] Diarrhea   Other Itching    Tomato    Vitamin D Analogs Swelling    Throat closing    Medications Reviewed Today     Reviewed by Melissa Montane, RN (Registered Nurse) on 05/29/21 at 41  Med List Status: <None>   Medication Order Taking? Sig Documenting Provider Last Dose Status Informant  baclofen (LIORESAL) 10 MG tablet [id number] No Take 10 mg by mouth 3 (three) times daily.  Patient not taking: No sig reported   [provider] Not Taking Active   cetirizine (ZYRTEC) 10 MG tablet [id number] Yes Take 1 tablet (10 mg total) by mouth daily. [name], MD Taking Active   escitalopram (LEXAPRO) 20 MG tablet [id number] No Take 20 mg by mouth daily.  Patient not  taking: No sig reported   [provider] Not Taking Active   fluticasone (FLONASE) 50 MCG/ACT nasal spray [id number] No Place 1 spray into both nostrils daily.  Patient not taking: No sig reported   [name], MD Not Taking Active   glimepiride (AMARYL) 4 MG tablet [id number] Yes Take 4 mg by mouth daily with breakfast. [provider] Taking Active   levothyroxine (SYNTHROID) 150 MCG tablet [id number] Yes Take 175 mcg by mouth daily before breakfast. [provider] Taking Active Self           Med Note [name], XYRISH   Sun Feb 03, 2021 10:04 AM)    losartan (COZAAR) 50 MG tablet [id number] Yes Take 50 mg by mouth daily. [provider] Taking Active Self  metFORMIN (GLUCOPHAGE-XR) 500 MG 24 hr tablet [id number] Yes Take 500 mg by mouth daily with breakfast. [provider] Taking Active Self  metoprolol succinate (TOPROL-XL) 50 MG 24 hr tablet [id number] Yes Take 50 mg by mouth daily. Take with or immediately following a meal. [provider] Taking Active   nystatin (MYCOSTATIN/NYSTOP) powder [id number] No Apply 1 application topically 2 (two) times daily.  Patient not taking: No sig reported   [provider] Not Taking Active   pioglitazone (ACTOS) 30 MG tablet [id number] Yes Take 30 mg by mouth daily. [provider] Taking Active   rosuvastatin (CRESTOR) 10 MG tablet [id number] Yes Take 20 mg by mouth daily.  [provider] Taking Active Self  tiZANidine (ZANAFLEX) 4 MG tablet [id number] No Take 1 tablet (4  mg total) by mouth every 6 (six) hours as needed for muscle spasms.  Patient not taking: No sig reported   Scot Jun, FNP Not Taking Active             Patient Active Problem List   Diagnosis Date Noted   Hypothyroidism 07/09/2020   Type 2 diabetes mellitus ([place]) 07/09/2020   Anaphylactic shock due to adverse food reaction 12/03/2019   Flexural atopic dermatitis 12/03/2019   Chronic  rhinitis 12/03/2019   Current smoker 12/03/2019    Conditions to be addressed/monitored per PCP order:  DMII  Care Plan : Diabetes Type 2 (Adult)  Updates made by Melissa Montane, RN since 05/29/2021 12:00 AM     Problem: Glycemic Management (Diabetes, Type 2)      Long-Range Goal: Glycemic Management Optimized   Start Date: 06/07/2020  Expected End Date: 06/28/2021  Recent Progress: Not on track  Priority: High  Note:   CARE PLAN ENTRY Medicaid Managed Care (see longtitudinal plan of care for additional care plan information)  Objective:   Lab Results  Component Value Date   CREATININE 0.57 11/18/2019   CREATININE 0.73 02/17/2019   HgbA1C found in Care Everywhere from 10/2020 was 9.8 Patient reported cbg findings: fasting 140-150   Current Barriers:  Knowledge Deficits related to basic Diabetes pathophysiology and self care/management. Patient reports that it has been a long time since she had diabetes education. She would like to improve her readings and is interested in information on diabetic diet. Patient would like next HgbA1C to be 8 or less. Patient doing better with meal choices, but continues to eat fast food due to busy schedule.Patient reports really trying to make better meal choices. Watched the Emmi video, has cut out potatoes and soda. Planning to start hiking when the weather warms up. Patient is dealing with a recent break up and had to move. Patient does not have all prescribed medications and has questions regarding medications. Patient has scheduled stress test on 7/27 and rescheduled endoscopy on 8/12. Patient with recent chest pain, currently wearing holter monitor and has cardiac cath scheduled for 8/16. Endoscopy procedure is on hold at this time. Patient is currently couch surfing and would like assistance with housing options. In between insurance currently as she was going to move to [place], but has since changed her mind. Patient is in the process of having  insurance plan reinstated.-Update-Patient now living in [place], [place], renting a room from a relative. Patient needs a PCP close to home. -Update- Patient was able to get in for dental care, continues to look for PCP in [place]. Patient attended follow up with Heart and Vascular, referral to sleep consult placed. Patient would like to establish care with a Psychiatrist for anxiety and depression. Patient does not have a social connection, doesn't feel supported by former church. Does not contact provider with questions or concerns Case Manager Clinical Goal(s):  patient will demonstrate improved adherence to prescribed treatment plan for diabetes self care/management as evidenced by:  daily monitoring and recording of CBG and report any findings over 350 to MD  adherence to ADA/ carb modified diet-Planning to stop buying/eating potatoes exercise 7 days/week-Met-patient reports exercising everyday adherence to prescribed medication regimen Interventions:  Provided education to patient about heart healthy and diabetic diet Discussed plans with patient for ongoing care management follow up and provided patient with direct contact information for care management team Reviewed scheduled/upcoming provider appointments including: call [facility] and Vascular to follow up  on referral to Sleep consult  Advised patient to call today to schedule PCP new patient visit for continuation of health management Provided patient with medical transportation provided by [facility] [phone], call 2-3 days before your appointment to arrange transportation Collaborate with LCSW, Brooke for anxiety and depression Referral to Care Guide for lacking social connections Reviewed medication and discussed the importance of finding a PCP to manage refills and dosing Provided therapeutic listening and encouragement Patient Self Care Activities:  Find a PCP in your area and schedule an appointment-take current list of  medications and list of items that you need to discuss Call Heart and Vascular office to follow up on referral to Sleep consult Schedule and attend dental appointment Work with MM Pharmacist for medication management  Self administers oral medications as prescribed Attends all scheduled provider appointments: Cardiac Cath 8/16 Checks blood sugars as prescribed and utilize hyper and hypoglycemia protocol as needed Adheres to prescribed ADA/carb modified Exercise for 30 min a day x 5 days a week  RNCM will follow up with a telephone visit on 06/28/21 @ 2:30pm      Follow Up:  Patient agrees to Care Plan and Follow-up.  Plan: The Managed Medicaid care management team will reach out to the patient again over the next 30 days.  Date/time of next scheduled RN care management/care coordination outreach:  06/28/21 @ 3:30pm  Lurena Joiner RN, BSN [place] RN Care Coordinator

## 2021-05-29 NOTE — Patient Instructions (Signed)
Visit Information  Pamela Rivers was given information about Medicaid Managed Care team care coordination services as a part of their [facility] Medicaid benefit. Pamela Rivers verbally consented to engagement with the [facility] Managed Care team.   If you are experiencing a medical emergency, please call 911 or report to your local emergency department or urgent care.   If you have a non-emergency medical problem during routine business hours, please contact your provider's office and ask to speak with a nurse.   For questions related to your [facility] health plan, please call: [phone] or go here:[email or web address]  If you would like to schedule transportation through your [facility] plan, please call the following number at least 2 days in advance of your appointment: [phone].  Call the [place] at [phone], at any time, 24 hours a day, 7 days a week. If you are in danger or need immediate medical attention call 911.  If you would like help to quit smoking, call 1-800-QUIT-NOW (865)460-3085) OR Espaol: 1-855-Djelo-Ya (8-453-646-8032) o para ms informacin haga clic aqu or Text READY to 200-400 to register via text  Pamela Rivers - following are the goals we discussed in your visit today:   Goals Addressed             This Visit's Progress    Monitor and Manage My Blood Sugar-Diabetes Type 2       Timeframe:  Long-Range Goal Priority:  High Start Date:    06/07/20                         Expected End Date:   06/28/21       Follow up 06/28/21         Find a PCP in your area and schedule an appointment-take current list of medications and list of items that you need to discuss Call Heart and Vascular office to follow up on referral to Sleep consult Schedule and attend dental appointment Work with MM Pharmacist for medication management  Self administers oral medications as prescribed Attends all scheduled provider  appointments Checks blood sugars as prescribed and utilize hyper and hypoglycemia protocol as needed Adheres to prescribed ADA/carb modified Exercise for 30 min a day x 5 days a week     Why is this important?   Checking your blood sugar at home helps to keep it from getting very high or very low.  Writing the results in a diary or log helps the doctor know how to care for you.  Your blood sugar log should have the time, date and the results.  Also, write down the amount of insulin or other medicine that you take.  Other information, like what you ate, exercise done and how you were feeling, will also be helpful.             Please see education materials related to smoking cessation provided by MyChart link.  Patient has access to MyChart and can view provided education  Telephone follow up appointment with Managed Medicaid care management team member scheduled for:  Lurena Joiner RN, BSN Cecilia RN Care Coordinator   Following is a copy of your plan of care:  Patient Care Plan: Diabetes Type 2 (Adult)     Problem Identified: Glycemic Management (Diabetes, Type 2)      Long-Range Goal: Glycemic Management Optimized   Start Date: 06/07/2020  Expected End Date: 06/28/2021  Recent Progress: Not on  track  Priority: High  Note:   CARE PLAN ENTRY Medicaid Managed Care (see longtitudinal plan of care for additional care plan information)  Objective:   Lab Results  Component Value Date   CREATININE 0.57 11/18/2019   CREATININE 0.73 02/17/2019   HgbA1C found in Care Everywhere from 10/2020 was 9.8 Patient reported cbg findings: fasting 140-150   Current Barriers:  Knowledge Deficits related to basic Diabetes pathophysiology and self care/management. Patient reports that it has been a long time since she had diabetes education. She would like to improve her readings and is interested in information on diabetic diet. Patient would like next  HgbA1C to be 8 or less. Patient doing better with meal choices, but continues to eat fast food due to busy schedule.Patient reports really trying to make better meal choices. Watched the Emmi video, has cut out potatoes and soda. Planning to start hiking when the weather warms up. Patient is dealing with a recent break up and had to move. Patient does not have all prescribed medications and has questions regarding medications. Patient has scheduled stress test on 7/27 and rescheduled endoscopy on 8/12. Patient with recent chest pain, currently wearing holter monitor and has cardiac cath scheduled for 8/16. Endoscopy procedure is on hold at this time. Patient is currently couch surfing and would like assistance with housing options. In between insurance currently as she was going to move to [place], but has since changed her mind. Patient is in the process of having insurance plan reinstated.-Update-Patient now living in [place], [place], renting a room from a relative. Patient needs a PCP close to home. -Update- Patient was able to get in for dental care, continues to look for PCP in [place]. Patient attended follow up with Heart and Vascular, referral to sleep consult placed. Patient would like to establish care with a Psychiatrist for anxiety and depression. Patient does not have a social connection, doesn't feel supported by former church. Does not contact provider with questions or concerns Case Manager Clinical Goal(s):  patient will demonstrate improved adherence to prescribed treatment plan for diabetes self care/management as evidenced by:  daily monitoring and recording of CBG and report any findings over 350 to MD  adherence to ADA/ carb modified diet-Planning to stop buying/eating potatoes exercise 7 days/week-Met-patient reports exercising everyday adherence to prescribed medication regimen Interventions:  Provided education to patient about heart healthy and diabetic diet Discussed plans with  patient for ongoing care management follow up and provided patient with direct contact information for care management team Reviewed scheduled/upcoming provider appointments including: call Heart and Vascular to follow up on referral to Sleep consult  Advised patient to call today to schedule PCP new patient visit for continuation of health management Provided patient with medical transportation provided by [facility] [phone], call 2-3 days before your appointment to arrange transportation Collaborate with LCSW, Brooke for anxiety and depression Referral to Care Guide for lacking social connections Reviewed medication and discussed the importance of finding a PCP to manage refills and dosing Provided therapeutic listening and encouragement Patient Self Care Activities:  Find a PCP in your area and schedule an appointment-take current list of medications and list of items that you need to discuss Call Heart and Vascular office to follow up on referral to Sleep consult Schedule and attend dental appointment Work with MM Pharmacist for medication management  Self administers oral medications as prescribed Attends all scheduled provider appointments: Cardiac Cath 8/16 Checks blood sugars as prescribed and utilize hyper and hypoglycemia protocol  as needed Adheres to prescribed ADA/carb modified Exercise for 30 min a day x 5 days a week  RNCM will follow up with a telephone visit on 06/28/21 @ 2:30pm

## 2021-06-07 ENCOUNTER — Telehealth: Payer: Self-pay | Admitting: *Deleted

## 2021-06-07 NOTE — Telephone Encounter (Signed)
   Telephone encounter was:  Successful.  06/07/2021 Name: Pamela Rivers MRN: [id number] DOB: [birth year]  Pamela Rivers is a 40 y.o. year old adult who is a primary care patient of System, Provider Not In . The community resource team was consulted for assistance with  housing and finding a PCP.  Care guide performed the following interventions: Patient provided with information about care guide support team and interviewed to confirm resource needs Patient is living in [place], [place] and found a PCP office close to her home but can't get in until January. They can get her in at an office a little farther away sooner and I advised if she has transportation to get there to go ahead and schedule an appt. Once she is established, she could ask to be transferred to the office across the street from her. I will research housing resources in [facility] and send those to her email address on file per her request .  Follow Up Plan:  Care guide will follow up with patient by phone over the next week.  April Green Care Guide, Embedded Care Coordination [facility], Care Management Phone: [phone] Email: [email or web address]

## 2021-06-11 ENCOUNTER — Telehealth: Payer: Self-pay

## 2021-06-11 NOTE — Telephone Encounter (Signed)
   Telephone encounter was:  Successful.  06/11/2021 Name: Pamela Rivers MRN: [id number] DOB: [birth year]  Pamela Rivers is a 40 y.o. year old adult who is a primary care patient of System, Provider Not In . The community resource team was consulted for assistance with  Patient is interested in receiving help with finding a pcp doctor as well as information being sent by e-mail regarding activities in the community, recreation centers and etc.   Care guide performed the following interventions: Patient provided with information about care guide support team and interviewed to confirm resource needs.  Follow Up Plan:  Care guide will follow up with patient by phone over the next fes days.  [facility] Guide, Embedded Care Coordination [facility]  [place], [place] [address]  Main Phone: [phone]  E-mail: [email or web address]  Website: [email or web address]

## 2021-06-17 ENCOUNTER — Telehealth: Payer: Self-pay | Admitting: *Deleted

## 2021-06-17 ENCOUNTER — Telehealth: Payer: Self-pay

## 2021-06-17 NOTE — Telephone Encounter (Signed)
   Telephone encounter was:  Successful.  06/17/2021 Name: Pamela Rivers MRN: [id number] DOB: 12-22-1980  Pamela Rivers is a 40 y.o. year old adult who is a primary care patient of System, Provider Not In . The community resource team was consulted for assistance with  : Patient advised she received e-mail about [employer] resources and PCP information. More activities are being researched. Pt is also looking for a new PCP but everyone is accepting in JAN. Search continues.  Care guide performed the following interventions: Patient provided with information about care guide support team and interviewed to confirm resource needs.  Follow Up Plan:  Care guide will follow up with patient by phone over the next few days.  [facility] Guide, Embedded Care Coordination [facility]  [place], [place] [address]  Main Phone: [phone]  E-mail: [email or web address]  Website: [email or web address]

## 2021-06-18 ENCOUNTER — Telehealth: Payer: Self-pay | Admitting: *Deleted

## 2021-06-18 NOTE — Telephone Encounter (Signed)
   Telephone encounter was:  Successful.  06/18/2021 Name: Pamela Rivers MRN: [id number] DOB: October 23, 1980  Pamela Rivers is a 40 y.o. year old adult who is a primary care patient of System, Provider Not In . The community resource team was consulted for assistance with  finding housing in [place], [place].  Care guide performed the following interventions:  Sent patient email with housing and Social Services resources for [facility] .  Follow Up Plan:  No further follow up planned at this time. The patient has been provided with needed resources.  April Green Care Guide, Embedded Care Coordination [facility], Care Management Phone: [phone] Email: [email or web address]

## 2021-06-21 ENCOUNTER — Other Ambulatory Visit: Payer: Self-pay

## 2021-06-21 NOTE — Patient Outreach (Signed)
[facility]) Care Management  06/21/2021  Pamela Rivers 1981/02/26 [id number]  LCSW completed [facility] outreach attempt today but was unable to reach patient successfully. A HIPPA compliant voice message was unable to be left encouraging patient to return call once available because the current phone line was not accepting calls at this time. Multiple attempts were made. LCSW will ask Scheduling Care Guide to reschedule [facility] appointment with patient as well.  [name], BSW, MSW, [employer] Managed Medicaid LCSW [facility]  [facility] [email or web address] Phone: [phone]

## 2021-06-21 NOTE — Patient Instructions (Signed)
Francene Finders ,   The [facility] Managed Care Team is available to provide assistance to you with your healthcare needs at no cost and as a benefit of your [facility] Health plan. I'm sorry I was unable to reach you today for our scheduled appointment. Our care guide will call you to reschedule our telephone appointment. Please call me at the number below. I am available to be of assistance to you regarding your healthcare needs. .   Thank you,   [name], BSW, MSW, LCSW Managed Medicaid LCSW [facility]  [address] [email or web address] Phone: [phone]

## 2021-06-28 ENCOUNTER — Telehealth: Payer: Self-pay

## 2021-06-28 ENCOUNTER — Other Ambulatory Visit: Payer: Self-pay | Admitting: *Deleted

## 2021-06-28 NOTE — Patient Outreach (Signed)
Care Coordination  06/28/2021  Errin Whitelaw [birth year] [id number]   Medicaid Managed Care   Unsuccessful Outreach Note  06/28/2021 Name: Pamela Rivers MRN: [id number] DOB: 12/26/1980  Referred by: System, Provider Not In Reason for referral : High Risk Managed Medicaid (Unsuccessful RNCM follow up outreach)   An unsuccessful telephone outreach was attempted today. The patient was referred to the case management team for assistance with care management and care coordination.   Follow Up Plan: The care management team will reach out to the patient again over the next 14 days.   [name] RN, BSN [place]  [facility]

## 2021-06-28 NOTE — Telephone Encounter (Signed)
   Telephone encounter was:  Successful.  06/28/2021 Name: Pamela Rivers MRN: [id number] DOB: 09-09-1980  Wallace Cogliano is a 40 y.o. year old adult who is a primary care patient of System, Provider Not In . The community resource team was consulted for assistance with  social support/resources in area  Care guide performed the following interventions:  Pt advised he is still unable to find a PCP that will see him before Jan. and would end up just being seen at an local urgent care if and when needed. I also sent pt another e-mail as well providing attractions, activities and support groups in his area.   Follow Up Plan:  No further follow up planned at this time. The patient has been provided with needed resources.  [facility] Guide, Embedded Care Coordination [facility]  [place], [place] [address]  Main Phone: [phone]  E-mail: [email or web address]  Website: [email or web address]

## 2021-06-28 NOTE — Patient Instructions (Signed)
Visit Information  Pamela Rivers  - as a part of your Medicaid benefit, you are eligible for care management and care coordination services at no cost or copay. I was unable to reach you by phone today but would be happy to help you with your health related needs. Please feel free to call me @ [phone].   A member of the Managed Medicaid care management team will reach out to you again over the next 14 days.   [name] RN, BSN [place]  [facility]

## 2021-07-05 ENCOUNTER — Other Ambulatory Visit: Payer: Self-pay | Admitting: *Deleted

## 2021-07-05 NOTE — Patient Outreach (Signed)
Care Coordination  07/05/2021  Pamela Rivers 1981-06-25 [id number]   Medicaid Managed Care   Unsuccessful Outreach Note  07/05/2021 Name: Pamela Rivers MRN: [id number] DOB: Jan 19, 1981  Referred by: System, Provider Not In Reason for referral : High Risk Managed Medicaid (Unsuccessful RNCM follow up telephone outreach)   A second unsuccessful telephone outreach was attempted today. The patient was referred to the case management team for assistance with care management and care coordination.   Follow Up Plan: The care management team will reach out to the patient again over the next 7 days.   [name] RN, BSN [place]  [facility]

## 2021-07-05 NOTE — Patient Instructions (Signed)
Visit Information  [name]  - as a part of your Medicaid benefit, you are eligible for care management and care coordination services at no cost or copay. I was unable to reach you by phone today but would be happy to help you with your health related needs. Please feel free to call me @ [phone].   A member of the Managed Medicaid care management team will reach out to you again over the next 7 days.   [name] RN, BSN [place]  [facility]

## 2021-07-16 ENCOUNTER — Other Ambulatory Visit: Payer: Self-pay

## 2021-07-16 NOTE — Patient Outreach (Signed)
Care Coordination  07/16/2021  Pamela Rivers 11/21/1980 [id number]   Medicaid Managed Care   Unsuccessful Outreach Note  07/16/2021 Name: [name] MRN: [id number] DOB: Nov 04, 1980  Referred by: System, Provider Not In Reason for referral : High Risk Managed Medicaid (Unsuccessful follow up outreach, 3rd attempt)   Third unsuccessful telephone outreach was attempted today. The patient was referred to the case management team for assistance with care management and care coordination. The patient's primary care provider has been notified of our unsuccessful attempts to make or maintain contact with the patient. The care management team is pleased to engage with this patient at any time in the future should he/she be interested in assistance from the care management team.   Follow Up Plan: We have been unable to make contact with the patient for follow up. The care management team is available to follow up with the patient after provider conversation with the patient regarding recommendation for care management engagement and subsequent re-referral to the care management team.   [name] RN, BSN [place]  [facility] RN Care Coordinator

## 2021-08-05 ENCOUNTER — Other Ambulatory Visit: Payer: Self-pay

## 2021-08-05 ENCOUNTER — Other Ambulatory Visit: Payer: Self-pay | Admitting: *Deleted
